# Patient Record
Sex: Female | Born: 1955 | Race: White | Hispanic: No | Smoking: Never smoker
Health system: Southern US, Community
[De-identification: ages and names within clinical notes are randomized; demographics above are authoritative.]

## PROBLEM LIST (undated history)

## (undated) DIAGNOSIS — K449 Diaphragmatic hernia without obstruction or gangrene: Secondary | ICD-10-CM

## (undated) DIAGNOSIS — Z90711 Acquired absence of uterus with remaining cervical stump: Secondary | ICD-10-CM

## (undated) DIAGNOSIS — M1990 Unspecified osteoarthritis, unspecified site: Secondary | ICD-10-CM

## (undated) DIAGNOSIS — F419 Anxiety disorder, unspecified: Secondary | ICD-10-CM

## (undated) DIAGNOSIS — Z9882 Breast implant status: Secondary | ICD-10-CM

## (undated) DIAGNOSIS — K2270 Barrett's esophagus without dysplasia: Secondary | ICD-10-CM

## (undated) HISTORY — PX: CHOLECYSTECTOMY: SHX55

## (undated) HISTORY — DX: Acquired absence of uterus with remaining cervical stump: Z90.711

## (undated) HISTORY — DX: Diaphragmatic hernia without obstruction or gangrene: K44.9

## (undated) HISTORY — DX: Unspecified osteoarthritis, unspecified site: M19.90

## (undated) HISTORY — DX: Breast implant status: Z98.82

## (undated) HISTORY — PX: OTHER SURGICAL HISTORY: SHX169

## (undated) HISTORY — PX: ABDOMINAL HYSTERECTOMY: SHX81

## (undated) HISTORY — DX: Barrett's esophagus without dysplasia: K22.70

## (undated) HISTORY — PX: WRIST SURGERY: SHX841

## (undated) HISTORY — DX: Anxiety disorder, unspecified: F41.9

## (undated) HISTORY — PX: NOSE SURGERY: SHX723

---

## 1978-07-26 DIAGNOSIS — Z90711 Acquired absence of uterus with remaining cervical stump: Secondary | ICD-10-CM

## 1978-07-26 HISTORY — DX: Acquired absence of uterus with remaining cervical stump: Z90.711

## 1987-07-27 HISTORY — PX: BREAST ENHANCEMENT SURGERY: SHX7

## 1988-07-26 DIAGNOSIS — Z9882 Breast implant status: Secondary | ICD-10-CM

## 1988-07-26 HISTORY — DX: Breast implant status: Z98.82

## 2013-08-15 ENCOUNTER — Ambulatory Visit (INDEPENDENT_AMBULATORY_CARE_PROVIDER_SITE_OTHER): Payer: BC Managed Care – PPO

## 2013-08-15 ENCOUNTER — Encounter: Payer: Self-pay | Admitting: Sports Medicine

## 2013-08-15 ENCOUNTER — Ambulatory Visit (INDEPENDENT_AMBULATORY_CARE_PROVIDER_SITE_OTHER): Payer: BC Managed Care – PPO | Admitting: Sports Medicine

## 2013-08-15 VITALS — BP 128/83 | HR 79 | Ht 64.0 in | Wt 151.0 lb

## 2013-08-15 DIAGNOSIS — M1710 Unilateral primary osteoarthritis, unspecified knee: Secondary | ICD-10-CM

## 2013-08-15 DIAGNOSIS — M19049 Primary osteoarthritis, unspecified hand: Secondary | ICD-10-CM

## 2013-08-15 DIAGNOSIS — F329 Major depressive disorder, single episode, unspecified: Secondary | ICD-10-CM

## 2013-08-15 DIAGNOSIS — M545 Low back pain, unspecified: Secondary | ICD-10-CM

## 2013-08-15 DIAGNOSIS — IMO0002 Reserved for concepts with insufficient information to code with codable children: Secondary | ICD-10-CM

## 2013-08-15 DIAGNOSIS — M170 Bilateral primary osteoarthritis of knee: Secondary | ICD-10-CM

## 2013-08-15 DIAGNOSIS — K209 Esophagitis, unspecified without bleeding: Secondary | ICD-10-CM

## 2013-08-15 DIAGNOSIS — F32A Depression, unspecified: Secondary | ICD-10-CM

## 2013-08-15 DIAGNOSIS — M47817 Spondylosis without myelopathy or radiculopathy, lumbosacral region: Secondary | ICD-10-CM

## 2013-08-15 DIAGNOSIS — M159 Polyosteoarthritis, unspecified: Secondary | ICD-10-CM

## 2013-08-15 DIAGNOSIS — Z0000 Encounter for general adult medical examination without abnormal findings: Secondary | ICD-10-CM | POA: Insufficient documentation

## 2013-08-15 DIAGNOSIS — Z113 Encounter for screening for infections with a predominantly sexual mode of transmission: Secondary | ICD-10-CM | POA: Insufficient documentation

## 2013-08-15 DIAGNOSIS — M25569 Pain in unspecified knee: Secondary | ICD-10-CM

## 2013-08-15 DIAGNOSIS — Z780 Asymptomatic menopausal state: Secondary | ICD-10-CM | POA: Insufficient documentation

## 2013-08-15 DIAGNOSIS — E663 Overweight: Secondary | ICD-10-CM

## 2013-08-15 DIAGNOSIS — F341 Dysthymic disorder: Secondary | ICD-10-CM

## 2013-08-15 DIAGNOSIS — K2270 Barrett's esophagus without dysplasia: Secondary | ICD-10-CM

## 2013-08-15 DIAGNOSIS — G4700 Insomnia, unspecified: Secondary | ICD-10-CM

## 2013-08-15 DIAGNOSIS — Z299 Encounter for prophylactic measures, unspecified: Secondary | ICD-10-CM

## 2013-08-15 DIAGNOSIS — F419 Anxiety disorder, unspecified: Secondary | ICD-10-CM

## 2013-08-15 DIAGNOSIS — N952 Postmenopausal atrophic vaginitis: Secondary | ICD-10-CM

## 2013-08-15 MED ORDER — ESCITALOPRAM OXALATE 20 MG PO TABS: 20.0000 mg | ORAL_TABLET | Freq: Every day | ORAL | Status: DC

## 2013-08-15 MED ORDER — MELOXICAM 15 MG PO TABS: ORAL_TABLET | ORAL | Status: DC

## 2013-08-15 MED ORDER — LINACLOTIDE 290 MCG PO CAPS: 290.0000 ug | ORAL_CAPSULE | Freq: Every day | ORAL | Status: DC

## 2013-08-15 MED ORDER — PHENTERMINE HCL 37.5 MG PO CAPS: 37.5000 mg | ORAL_CAPSULE | ORAL | Status: DC

## 2013-08-15 MED ORDER — ZOLPIDEM TARTRATE 5 MG PO TABS: 5.0000 mg | ORAL_TABLET | Freq: Every evening | ORAL | Status: DC | PRN

## 2013-08-15 MED ORDER — ALPRAZOLAM 1 MG PO TABS: 1.0000 mg | ORAL_TABLET | Freq: Every evening | ORAL | Status: DC | PRN

## 2013-08-15 MED ORDER — ESTROGENS, CONJUGATED 0.625 MG/GM VA CREA: TOPICAL_CREAM | VAGINAL | Status: DC

## 2013-08-15 MED ORDER — DEXLANSOPRAZOLE 60 MG PO CPDR: 60.0000 mg | DELAYED_RELEASE_CAPSULE | Freq: Every day | ORAL | Status: DC

## 2013-08-15 NOTE — Assessment & Plan Note (Signed)
X-rays. Mobic. Herniated disc exercises.

## 2013-08-15 NOTE — Assessment & Plan Note (Signed)
Continue Ambien. 

## 2013-08-15 NOTE — Assessment & Plan Note (Addendum)
Referral to OB/GYN for cervical cancer screening. Mammogram was 6 months ago. We will discuss further screening measures and labs at future visits.

## 2013-08-15 NOTE — Assessment & Plan Note (Signed)
X-rays, Mobic, home rehabilitation. Return in 4 weeks, injection if no better.

## 2013-08-15 NOTE — Assessment & Plan Note (Signed)
Overweight with complications of knee osteoarthritis. Adding phentermine.  Return monthly for weight check and refills.

## 2013-08-15 NOTE — Assessment & Plan Note (Signed)
Continue Lexapro and Xanax. Refilling.

## 2013-08-15 NOTE — Assessment & Plan Note (Signed)
X-rays, Mobic.

## 2013-08-15 NOTE — Progress Notes (Signed)
  Subjective:    CC: Establish care.   HPI: This is a very pleasant 58 year old female desires to switch providers, her husband died recently, and her prior PCP used to treat both of them, she desires a new change. She has multiple issues to discuss.  Anxiety and depression: Stable on Lexapro and alprazolam.  Atrophic vaginitis: Currently gets occasional burning and stinging of her vagina, mild itching without discharge, she is looking to get back into dating, and wonders if there is a cream that can help this. She is currently on oral estrogen replacement. She is status post partial hysterectomy.  Barrett's esophagus: very stable currently on PPIs.  Hand pain: Localized at the distal interphalangeal joints of both second and third fingers. She does have some nodules. Has not taken anything for this.   Low back pain: Axial without radiation, desires imaging.  Bilateral knee pain: Medial joint line and under the kneecap, worse when going up and down stairs, minimal swelling. Not taking any medication for this.  Insomnia: Well controlled with Ambien.  Overweight with complications: Desires to restart phentermine.  Preventive measure: Due for a Pap smear, she did have a partial hysterectomy, she does desire referral to gynecology here in the area.  Past medical history, Surgical history, Family history not pertinant except as noted below, Social history, Allergies, and medications have been entered into the medical record, reviewed, and no changes needed.   Review of Systems: No headache, visual changes, nausea, vomiting, diarrhea, constipation, dizziness, abdominal pain, skin rash, fevers, chills, night sweats, swollen lymph nodes, weight loss, chest pain, body aches, joint swelling, muscle aches, shortness of breath, mood changes, visual or auditory hallucinations.  Objective:    General: Well Developed, well nourished, and in no acute distress.  Neuro: Alert and oriented x3,  extra-ocular muscles intact, sensation grossly intact.  HEENT: Normocephalic, atraumatic, pupils equal round reactive to light, neck supple, no masses, no lymphadenopathy, thyroid nonpalpable.  Skin: Warm and dry, no rashes noted.  Cardiac: Regular rate and rhythm, no murmurs rubs or gallops.  Respiratory: Clear to auscultation bilaterally. Not using accessory muscles, speaking in full sentences.  Abdominal: Soft, nontender, nondistended, positive bowel sounds, no masses, no organomegaly.  Hands: Bilateral Bouchard and Heberden nodes. Bilateral Knee: Normal to inspection with no erythema or effusion or obvious bony abnormalities. Tender palpation of the medial joint lines.  all ligaments are intact and stable.  Negative Mcmurray's, Apley's, and Thessalonian tests. Painful patellar compression with crepitus Patellar and quadriceps tendons unremarkable. Hamstring and quadriceps strength is normal.   Impression and Recommendations:    The patient was counselled, risk factors were discussed, anticipatory guidance given.

## 2013-08-15 NOTE — Assessment & Plan Note (Signed)
Estrogen cream. I'm going to refer her [facility].

## 2013-08-15 NOTE — Assessment & Plan Note (Signed)
Continue Dexilant 

## 2013-08-20 MED ORDER — ESTRADIOL 0.1 MG/GM VA CREA
TOPICAL_CREAM | VAGINAL | Status: DC
Start: 2013-08-20 — End: 2015-03-14

## 2013-09-12 ENCOUNTER — Ambulatory Visit: Payer: Self-pay | Admitting: Sports Medicine

## 2013-09-19 ENCOUNTER — Encounter: Payer: BC Managed Care – PPO | Admitting: Obstetrics & Gynecology

## 2013-09-20 ENCOUNTER — Other Ambulatory Visit: Payer: Self-pay | Admitting: Sports Medicine

## 2013-09-21 ENCOUNTER — Other Ambulatory Visit: Payer: Self-pay

## 2013-10-17 ENCOUNTER — Encounter: Payer: BC Managed Care – PPO | Admitting: Obstetrics & Gynecology

## 2013-10-24 ENCOUNTER — Other Ambulatory Visit: Payer: Self-pay | Admitting: Sports Medicine

## 2013-11-15 ENCOUNTER — Other Ambulatory Visit: Payer: Self-pay

## 2013-11-15 ENCOUNTER — Other Ambulatory Visit: Payer: Self-pay | Admitting: Sports Medicine

## 2013-11-27 ENCOUNTER — Other Ambulatory Visit: Payer: Self-pay | Admitting: Sports Medicine

## 2013-11-27 ENCOUNTER — Other Ambulatory Visit: Payer: Self-pay

## 2013-12-11 ENCOUNTER — Ambulatory Visit: Payer: BC Managed Care – PPO | Admitting: Sports Medicine

## 2014-01-04 ENCOUNTER — Other Ambulatory Visit: Payer: Self-pay

## 2014-01-04 ENCOUNTER — Other Ambulatory Visit: Payer: Self-pay | Admitting: Sports Medicine

## 2014-01-06 ENCOUNTER — Other Ambulatory Visit: Payer: Self-pay | Admitting: Sports Medicine

## 2014-01-31 ENCOUNTER — Encounter: Payer: Self-pay | Admitting: Sports Medicine

## 2014-02-02 ENCOUNTER — Other Ambulatory Visit: Payer: Self-pay | Admitting: Sports Medicine

## 2014-02-04 ENCOUNTER — Other Ambulatory Visit: Payer: Self-pay

## 2014-02-07 ENCOUNTER — Other Ambulatory Visit: Payer: Self-pay | Admitting: Sports Medicine

## 2014-02-08 ENCOUNTER — Other Ambulatory Visit: Payer: Self-pay

## 2014-03-15 ENCOUNTER — Other Ambulatory Visit: Payer: Self-pay | Admitting: Sports Medicine

## 2014-03-15 ENCOUNTER — Telehealth: Payer: Self-pay

## 2014-03-15 NOTE — Telephone Encounter (Signed)
Error. Rhonda Cunningham,CMA  

## 2014-03-22 ENCOUNTER — Other Ambulatory Visit: Payer: Self-pay | Admitting: Sports Medicine

## 2014-03-22 MED ORDER — ZOLPIDEM TARTRATE 10 MG PO TABS
10.0000 mg | ORAL_TABLET | Freq: Every day | ORAL | Status: DC
Start: 2014-03-22 — End: 2014-04-23

## 2014-04-23 ENCOUNTER — Other Ambulatory Visit: Payer: Self-pay | Admitting: Sports Medicine

## 2014-05-01 ENCOUNTER — Other Ambulatory Visit: Payer: Self-pay | Admitting: Sports Medicine

## 2014-05-03 ENCOUNTER — Other Ambulatory Visit: Payer: Self-pay

## 2014-05-03 MED ORDER — ALPRAZOLAM 1 MG PO TABS: 1.0000 mg | ORAL_TABLET | ORAL | Status: DC | PRN

## 2014-05-28 ENCOUNTER — Ambulatory Visit (INDEPENDENT_AMBULATORY_CARE_PROVIDER_SITE_OTHER): Payer: BC Managed Care – PPO

## 2014-05-28 ENCOUNTER — Other Ambulatory Visit: Payer: Self-pay

## 2014-05-28 ENCOUNTER — Encounter: Payer: Self-pay | Admitting: Sports Medicine

## 2014-05-28 ENCOUNTER — Ambulatory Visit (INDEPENDENT_AMBULATORY_CARE_PROVIDER_SITE_OTHER): Payer: BC Managed Care – PPO | Admitting: Sports Medicine

## 2014-05-28 VITALS — BP 143/83 | HR 71 | Wt 151.0 lb

## 2014-05-28 VITALS — BP 143/83 | HR 71 | Ht 66.0 in | Wt 151.0 lb

## 2014-05-28 DIAGNOSIS — R0789 Other chest pain: Secondary | ICD-10-CM

## 2014-05-28 DIAGNOSIS — E663 Overweight: Secondary | ICD-10-CM | POA: Diagnosis not present

## 2014-05-28 DIAGNOSIS — R079 Chest pain, unspecified: Secondary | ICD-10-CM

## 2014-05-28 DIAGNOSIS — F32A Depression, unspecified: Secondary | ICD-10-CM

## 2014-05-28 DIAGNOSIS — M159 Polyosteoarthritis, unspecified: Secondary | ICD-10-CM

## 2014-05-28 DIAGNOSIS — E785 Hyperlipidemia, unspecified: Secondary | ICD-10-CM

## 2014-05-28 DIAGNOSIS — F329 Major depressive disorder, single episode, unspecified: Secondary | ICD-10-CM

## 2014-05-28 DIAGNOSIS — F418 Other specified anxiety disorders: Secondary | ICD-10-CM | POA: Diagnosis not present

## 2014-05-28 DIAGNOSIS — F419 Anxiety disorder, unspecified: Secondary | ICD-10-CM

## 2014-05-28 LAB — CBC
HCT: 38.1 % (ref 36.0–46.0)
Hemoglobin: 13.5 g/dL (ref 12.0–15.0)
MCH: 30.7 pg (ref 26.0–34.0)
MCHC: 35.4 g/dL (ref 30.0–36.0)
MCV: 86.6 fL (ref 78.0–100.0)
Platelets: 268 10*3/uL (ref 150–400)
RBC: 4.4 MIL/uL (ref 3.87–5.11)
RDW: 13.1 % (ref 11.5–15.5)
WBC: 5.4 10*3/uL (ref 4.0–10.5)

## 2014-05-28 LAB — COMPREHENSIVE METABOLIC PANEL WITH GFR
Albumin: 4.5 g/dL (ref 3.5–5.2)
BUN: 14 mg/dL (ref 6–23)
Glucose, Bld: 91 mg/dL (ref 70–99)
Potassium: 4.3 meq/L (ref 3.5–5.3)
Sodium: 137 meq/L (ref 135–145)
Total Protein: 6.8 g/dL (ref 6.0–8.3)

## 2014-05-28 LAB — TSH: TSH: 0.847 u[IU]/mL (ref 0.350–4.500)

## 2014-05-28 LAB — COMPREHENSIVE METABOLIC PANEL
ALT: 24 U/L (ref 0–35)
AST: 27 U/L (ref 0–37)
Alkaline Phosphatase: 58 U/L (ref 39–117)
CO2: 24 mEq/L (ref 19–32)
Calcium: 9.3 mg/dL (ref 8.4–10.5)
Chloride: 102 mEq/L (ref 96–112)
Creat: 0.76 mg/dL (ref 0.50–1.10)
Total Bilirubin: 0.5 mg/dL (ref 0.2–1.2)

## 2014-05-28 LAB — SEDIMENTATION RATE: Sed Rate: 1 mm/hr (ref 0–22)

## 2014-05-28 LAB — CK TOTAL AND CKMB (NOT AT ARMC)
CK, MB: 2 ng/mL (ref 0.0–5.0)
Relative Index: 2 (ref 0.0–4.0)
Total CK: 102 U/L (ref 7–177)

## 2014-05-28 LAB — LIPID PANEL
Cholesterol: 256 mg/dL — ABNORMAL HIGH (ref 0–200)
HDL: 66 mg/dL (ref >39)
LDL Cholesterol: 155 mg/dL — ABNORMAL HIGH (ref 0–99)
Total CHOL/HDL Ratio: 3.9 Ratio
Triglycerides: 176 mg/dL — ABNORMAL HIGH (ref <150)
VLDL: 35 mg/dL (ref 0–40)

## 2014-05-28 LAB — D-DIMER, QUANTITATIVE: D-Dimer, Quant: 0.48 ug{FEU}/mL (ref 0.00–0.48)

## 2014-05-28 LAB — TROPONIN I: Troponin I: <0.01 ng/mL (ref <0.06)

## 2014-05-28 LAB — HEMOGLOBIN A1C
Hgb A1c MFr Bld: 5.2 % (ref <5.7)
Mean Plasma Glucose: 103 mg/dL (ref <117)

## 2014-05-28 LAB — RHEUMATOID FACTOR: Rheumatoid fact SerPl-aCnc: <10 [IU]/mL (ref <=14)

## 2014-05-28 MED ORDER — ASPIRIN EC 81 MG PO TBEC: 81.0000 mg | DELAYED_RELEASE_TABLET | Freq: Every day | ORAL | Status: DC

## 2014-05-28 MED ORDER — NITROGLYCERIN 0.4 MG SL SUBL: 0.4000 mg | SUBLINGUAL_TABLET | SUBLINGUAL | Status: DC | PRN

## 2014-05-28 NOTE — Progress Notes (Signed)
  Subjective:    CC: chest pain and fatigue  HPI: This is a pleasant 58 year old female with anxiety and depression who comes in with a several week history of substernal chest pain radiating to the back, worse with exertion, better with rest, not associated with palpitations or nausea. No presyncope. Symptoms are also precipitated by stress and anxiety. She does endorse shortness of breath. She has no cardiac history, and has never had a stress test.  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 X-rays in the past have shown diffuse degenerative changes.  Past medical history, Surgical history, Family history not pertinant except as noted below, Social history, Allergies, and medications have been entered into the medical record, reviewed, and no changes needed.   Review of Systems: No fevers, chills, night sweats, weight loss, chest pain, or shortness of breath.   Objective:    General: Well Developed, well nourished, and in no acute distress.  Neuro: Alert and oriented x3, extra-ocular muscles intact, sensation grossly intact.  HEENT: Normocephalic, atraumatic, pupils equal round reactive to light, neck supple, no masses, no lymphadenopathy, thyroid nonpalpable.  Skin: Warm and dry, no rashes. Cardiac: Regular rate and rhythm, no murmurs rubs or gallops, no lower extremity edema.  Respiratory: Clear to auscultation bilaterally. Not using accessory muscles, speaking in full sentences.  Twelve-lead EKG shows mild bradycardia, no PR abnormalities, QRS, ST segments all unremarkable. Axis is normal.  Impression and Recommendations:

## 2014-05-28 NOTE — Assessment & Plan Note (Signed)
Discontinue phentermine for now. We can reconsider this in the future after catheterization versus stress test.

## 2014-05-28 NOTE — Assessment & Plan Note (Signed)
Rheumatoid workup. X-rays did show osteoarthritis. We can do interphalangeal joint injections at a future visit.

## 2014-05-28 NOTE — Assessment & Plan Note (Addendum)
Intermittent but substernal, radiating to the back. Relieved with rest, associated with sweating. Nitroglycerin, baby aspirin, referral to cardiology, I do think they will need to proceed directly to catheterization. Checking blood work including d-dimer, cardiac enzymes. Twelve-lead EKG. Return in 2 weeks. She will call me or 911 if she gets a recurrence of chest pain that is not relieved with nitroglycerin.

## 2014-05-28 NOTE — Progress Notes (Signed)
   Subjective:    Patient ID: Katherine Lawson, female    DOB: January 14, 1956, 58 y.o.   MRN: [id number]  HPI  Katherine Lawson is here for a EKG.  Review of Systems     Objective:   Physical Exam        Assessment & Plan:  EKG reviewed by Rodney Langtonhomas Thekkekandam, MD

## 2014-05-28 NOTE — Assessment & Plan Note (Signed)
Certainly her anxiety and panic could be a cause of her chest pain. We will rule out a cardiovascular cause first.

## 2014-05-29 DIAGNOSIS — E782 Mixed hyperlipidemia: Secondary | ICD-10-CM | POA: Insufficient documentation

## 2014-05-29 DIAGNOSIS — E785 Hyperlipidemia, unspecified: Secondary | ICD-10-CM | POA: Insufficient documentation

## 2014-05-29 LAB — CYCLIC CITRUL PEPTIDE ANTIBODY, IGG: Cyclic Citrullin Peptide Ab: <2 U/mL (ref 0.0–5.0)

## 2014-05-29 LAB — ANA: Anti Nuclear Antibody(ANA): NEGATIVE

## 2014-05-29 MED ORDER — ATORVASTATIN CALCIUM 40 MG PO TABS: 40.0000 mg | ORAL_TABLET | Freq: Every day | ORAL | Status: DC

## 2014-05-29 NOTE — Addendum Note (Signed)
Addended by: Monica BectonHEKKEKANDAM, Melvine Julin J on: 05/29/2014 12:01 PM   Modules accepted: Orders

## 2014-05-29 NOTE — Assessment & Plan Note (Signed)
Adding high-dose statin.

## 2014-05-31 ENCOUNTER — Other Ambulatory Visit: Payer: Self-pay | Admitting: Sports Medicine

## 2014-06-05 ENCOUNTER — Encounter: Payer: Self-pay | Admitting: Sports Medicine

## 2014-06-11 ENCOUNTER — Encounter: Payer: Self-pay | Admitting: Sports Medicine

## 2014-06-11 ENCOUNTER — Ambulatory Visit (INDEPENDENT_AMBULATORY_CARE_PROVIDER_SITE_OTHER): Payer: BC Managed Care – PPO | Admitting: Sports Medicine

## 2014-06-11 VITALS — BP 125/75 | HR 67 | Ht 64.0 in | Wt 150.0 lb

## 2014-06-11 DIAGNOSIS — F418 Other specified anxiety disorders: Secondary | ICD-10-CM

## 2014-06-11 DIAGNOSIS — R079 Chest pain, unspecified: Secondary | ICD-10-CM

## 2014-06-11 DIAGNOSIS — F329 Major depressive disorder, single episode, unspecified: Secondary | ICD-10-CM

## 2014-06-11 DIAGNOSIS — M159 Polyosteoarthritis, unspecified: Secondary | ICD-10-CM | POA: Diagnosis not present

## 2014-06-11 DIAGNOSIS — Z23 Encounter for immunization: Secondary | ICD-10-CM

## 2014-06-11 DIAGNOSIS — F419 Anxiety disorder, unspecified: Secondary | ICD-10-CM

## 2014-06-11 DIAGNOSIS — E785 Hyperlipidemia, unspecified: Secondary | ICD-10-CM

## 2014-06-11 DIAGNOSIS — F32A Depression, unspecified: Secondary | ICD-10-CM

## 2014-06-11 MED ORDER — ALPRAZOLAM 1 MG PO TABS: 1.0000 mg | ORAL_TABLET | ORAL | Status: DC | PRN

## 2014-06-11 NOTE — Assessment & Plan Note (Addendum)
Certainly worsen, it is her husband's death anniversary. Continue current medication for now. We are going to rule out a cardiac etiology of her chest pain before blaming it on panic. Refilling alprazolam, #30 per month.

## 2014-06-11 NOTE — Progress Notes (Signed)
  Subjective:    CC: follow-up  HPI: Chest pain: Persistent when stressed, she did see cardiology and they are planning stress test.  Cardiac enzymes were all negative the last visit.  Hyperlipidemia: Continuing Lipitor.  Hand osteoarthritis: Moderate, persistent, localized at the left second DIP in the right third DIP, amenable to trying injection today.  Anxiety and depression: Stable, using approximately 1 alprazolam per day, no suicidal or homicidal ideation.  Past medical history, Surgical history, Family history not pertinant except as noted below, Social history, Allergies, and medications have been entered into the medical record, reviewed, and no changes needed.   Review of Systems: No fevers, chills, night sweats, weight loss, chest pain, or shortness of breath.   Objective:    General: Well Developed, well nourished, and in no acute distress.  Neuro: Alert and oriented x3, extra-ocular muscles intact, sensation grossly intact.  HEENT: Normocephalic, atraumatic, pupils equal round reactive to light, neck supple, no masses, no lymphadenopathy, thyroid nonpalpable.  Skin: Warm and dry, no rashes. Cardiac: Regular rate and rhythm, no murmurs rubs or gallops, no lower extremity edema.  Respiratory: Clear to auscultation bilaterally. Not using accessory muscles, speaking in full sentences.  Procedure: Real-time Ultrasound Guided Injection of left second distal interphalangeal joint Device: GE Logiq E  Verbal informed consent obtained.  Time-out conducted.  Noted no overlying erythema, induration, or other signs of local infection.  Skin prepped in a sterile fashion.  Local anesthesia: Topical Ethyl chloride.  With sterile technique and under real time ultrasound guidance:  0.5 mL kenalog 40, 0.5 mL lidocaine injected easily. Completed without difficulty  Pain immediately resolved suggesting accurate placement of the medication.  Advised to call if fevers/chills, erythema,  induration, drainage, or persistent bleeding.  Images permanently stored and available for review in the ultrasound unit.  Impression: Technically successful ultrasound guided injection.  Procedure: Real-time Ultrasound Guided Injection of right third distal interphalangeal joint Device: GE Logiq E  Verbal informed consent obtained.  Time-out conducted.  Noted no overlying erythema, induration, or other signs of local infection.  Skin prepped in a sterile fashion.  Local anesthesia: Topical Ethyl chloride.  With sterile technique and under real time ultrasound guidance:  0.5 mL kenalog 40, 0.5 mL lidocaine injected easily. Completed without difficulty  Pain immediately resolved suggesting accurate placement of the medication.  Advised to call if fevers/chills, erythema, induration, drainage, or persistent bleeding.  Images permanently stored and available for review in the ultrasound unit.  Impression: Technically successful ultrasound guided injection.  Impression and Recommendations:

## 2014-06-11 NOTE — Assessment & Plan Note (Signed)
Does have a stress test coming up in a couple of days with cardiology. Currently treating her hyperlipidemia aggressively.

## 2014-06-11 NOTE — Assessment & Plan Note (Addendum)
Left second and right third distal interphalangeal joint injections as above. Return in one month.

## 2014-06-11 NOTE — Assessment & Plan Note (Signed)
-  Continue Lipitor °

## 2014-07-03 ENCOUNTER — Institutional Professional Consult (permissible substitution): Payer: BC Managed Care – PPO | Admitting: Cardiology

## 2014-07-05 ENCOUNTER — Other Ambulatory Visit: Payer: Self-pay | Admitting: Sports Medicine

## 2014-07-10 ENCOUNTER — Encounter: Payer: Self-pay | Admitting: Sports Medicine

## 2014-07-11 ENCOUNTER — Ambulatory Visit: Payer: BC Managed Care – PPO | Admitting: Sports Medicine

## 2014-07-12 ENCOUNTER — Other Ambulatory Visit: Payer: Self-pay | Admitting: Sports Medicine

## 2014-07-14 ENCOUNTER — Other Ambulatory Visit: Payer: Self-pay | Admitting: Sports Medicine

## 2014-07-21 ENCOUNTER — Other Ambulatory Visit: Payer: Self-pay | Admitting: Sports Medicine

## 2014-08-05 ENCOUNTER — Ambulatory Visit: Payer: BC Managed Care – PPO | Admitting: Sports Medicine

## 2014-08-09 ENCOUNTER — Other Ambulatory Visit: Payer: Self-pay | Admitting: Sports Medicine

## 2014-08-19 ENCOUNTER — Other Ambulatory Visit: Payer: Self-pay | Admitting: Sports Medicine

## 2014-09-04 ENCOUNTER — Telehealth: Payer: Self-pay

## 2014-09-04 NOTE — Telephone Encounter (Signed)
Faxed prior auth paperwork for Dexilant to [id number] at 9:00am 09/04/14 - CF

## 2014-09-04 NOTE — Telephone Encounter (Signed)
Received fax from [facility] they have stated that the Dexilant is on the plans list of covered meds and patient can get prescription filled at pharmacy. - CF

## 2014-09-09 ENCOUNTER — Other Ambulatory Visit: Payer: Self-pay | Admitting: Sports Medicine

## 2014-09-16 ENCOUNTER — Other Ambulatory Visit: Payer: Self-pay | Admitting: Sports Medicine

## 2014-09-29 ENCOUNTER — Other Ambulatory Visit: Payer: Self-pay | Admitting: Sports Medicine

## 2014-10-14 ENCOUNTER — Other Ambulatory Visit: Payer: Self-pay | Admitting: Sports Medicine

## 2014-10-15 ENCOUNTER — Telehealth: Payer: Self-pay

## 2014-10-15 NOTE — Telephone Encounter (Signed)
Pharmacist called requested clarification on directions for Xanax that was sent to pharmacy for patient. Please clarify. Rhonda Cunningham,CMA

## 2014-10-15 NOTE — Telephone Encounter (Signed)
PHARMACY HAS BEEN NOTIFIED. Katherine Lawson,CMA

## 2014-10-15 NOTE — Telephone Encounter (Signed)
1 tablet up to daily as needed

## 2014-10-17 ENCOUNTER — Telehealth: Payer: Self-pay

## 2014-10-17 MED ORDER — ZOLPIDEM TARTRATE 10 MG PO TABS: ORAL_TABLET | ORAL | Status: DC

## 2014-10-17 NOTE — Telephone Encounter (Signed)
Patient request refill for Ambien 10 mg. Patient was scheduled a follow up appt. Rhonda Cunningham,CMA

## 2014-10-25 ENCOUNTER — Ambulatory Visit (INDEPENDENT_AMBULATORY_CARE_PROVIDER_SITE_OTHER): Payer: 59 | Admitting: Sports Medicine

## 2014-10-25 ENCOUNTER — Encounter: Payer: Self-pay | Admitting: Sports Medicine

## 2014-10-25 VITALS — BP 118/72 | HR 70 | Ht 64.0 in | Wt 154.0 lb

## 2014-10-25 DIAGNOSIS — Z780 Asymptomatic menopausal state: Secondary | ICD-10-CM | POA: Diagnosis not present

## 2014-10-25 DIAGNOSIS — R079 Chest pain, unspecified: Secondary | ICD-10-CM

## 2014-10-25 DIAGNOSIS — E663 Overweight: Secondary | ICD-10-CM | POA: Diagnosis not present

## 2014-10-25 MED ORDER — PHENTERMINE HCL 37.5 MG PO TABS: ORAL_TABLET | ORAL | Status: DC

## 2014-10-25 NOTE — Progress Notes (Signed)
  Subjective:    CC: Discuss hormone replacement  HPI: Chest pain: Negative cardiac stress test.  Overweight: Minimal to start weight loss medication.  Perimenopausal/postmenopausal: Is currently getting implantable estrogen by a local complementary and alternative medicine clinic, would like me to add Premarin cream.  Past medical history, Surgical history, Family history not pertinant except as noted below, Social history, Allergies, and medications have been entered into the medical record, reviewed, and no changes needed.   Review of Systems: No fevers, chills, night sweats, weight loss, chest pain, or shortness of breath.   Objective:    General: Well Developed, well nourished, and in no acute distress.  Neuro: Alert and oriented x3, extra-ocular muscles intact, sensation grossly intact.  HEENT: Normocephalic, atraumatic, pupils equal round reactive to light, neck supple, no masses, no lymphadenopathy, thyroid nonpalpable.  Skin: Warm and dry, no rashes. Cardiac: Regular rate and rhythm, no murmurs rubs or gallops, no lower extremity edema.  Respiratory: Clear to auscultation bilaterally. Not using accessory muscles, speaking in full sentences.  Impression and Recommendations:

## 2014-10-25 NOTE — Assessment & Plan Note (Signed)
History of stress test, chest pain was likely due to anxiety.

## 2014-10-25 NOTE — Assessment & Plan Note (Signed)
Overweight with osteoarthritis. Next line starting phentermine. Return monthly for weight checks and refills.

## 2014-10-25 NOTE — Assessment & Plan Note (Addendum)
Patient will continue hormone replacement both injectable as well as topical through her complimentary and alternative providers. They seem to be worried about bone density loss, we will simply check a bone densitometry test.

## 2014-10-30 ENCOUNTER — Other Ambulatory Visit: Payer: 59

## 2014-11-12 ENCOUNTER — Other Ambulatory Visit: Payer: Self-pay | Admitting: Sports Medicine

## 2014-11-15 ENCOUNTER — Other Ambulatory Visit: Payer: Self-pay | Admitting: Sports Medicine

## 2014-11-17 ENCOUNTER — Other Ambulatory Visit: Payer: Self-pay | Admitting: Sports Medicine

## 2014-11-22 ENCOUNTER — Ambulatory Visit (INDEPENDENT_AMBULATORY_CARE_PROVIDER_SITE_OTHER): Payer: 59 | Admitting: Sports Medicine

## 2014-11-22 ENCOUNTER — Encounter: Payer: Self-pay | Admitting: Sports Medicine

## 2014-11-22 DIAGNOSIS — M170 Bilateral primary osteoarthritis of knee: Secondary | ICD-10-CM | POA: Diagnosis not present

## 2014-11-22 DIAGNOSIS — E663 Overweight: Secondary | ICD-10-CM

## 2014-11-22 MED ORDER — MELOXICAM 15 MG PO TABS: 15.0000 mg | ORAL_TABLET | Freq: Every day | ORAL | Status: DC

## 2014-11-22 MED ORDER — TOPIRAMATE 50 MG PO TABS: ORAL_TABLET | ORAL | Status: DC

## 2014-11-22 MED ORDER — PHENTERMINE HCL 37.5 MG PO TABS: ORAL_TABLET | ORAL | Status: DC

## 2014-11-22 NOTE — Assessment & Plan Note (Signed)
5 pound weight loss on phentermine. We discussed some dieting strategies as well as alternatives to Pasta and carbohydrates. Adding Topamax and refilling intermittent. Return in one month for a weight check.

## 2014-11-22 NOTE — Progress Notes (Signed)
  Subjective:    CC: Follow-up  HPI: Obesity: 5 pound weight loss since last visit, has struggled with cutting out carbohydrates.  Past medical history, Surgical history, Family history not pertinant except as noted below, Social history, Allergies, and medications have been entered into the medical record, reviewed, and no changes needed.   Review of Systems: No fevers, chills, night sweats, weight loss, chest pain, or shortness of breath.   Objective:    General: Well Developed, well nourished, and in no acute distress.  Neuro: Alert and oriented x3, extra-ocular muscles intact, sensation grossly intact.  HEENT: Normocephalic, atraumatic, pupils equal round reactive to light, neck supple, no masses, no lymphadenopathy, thyroid nonpalpable.  Skin: Warm and dry, no rashes. Cardiac: Regular rate and rhythm, no murmurs rubs or gallops, no lower extremity edema.  Respiratory: Clear to auscultation bilaterally. Not using accessory muscles, speaking in full sentences.  Impression and Recommendations:    I spent 25 minutes with this patient, greater than 50% was face-to-face time counseling regarding the above diagnosis.

## 2014-11-22 NOTE — Assessment & Plan Note (Signed)
Doing well with meloxicam, refilling medication.

## 2014-12-15 ENCOUNTER — Other Ambulatory Visit: Payer: Self-pay | Admitting: Sports Medicine

## 2014-12-20 ENCOUNTER — Ambulatory Visit: Payer: 59 | Admitting: Sports Medicine

## 2014-12-23 ENCOUNTER — Other Ambulatory Visit: Payer: Self-pay | Admitting: Sports Medicine

## 2014-12-24 NOTE — Telephone Encounter (Signed)
Request for Phentermine, refused. Pt needs an appt for refills on that Rx. Attempted to contact Pt to set appt, no answer. Left voicemail providing callback information.

## 2014-12-31 ENCOUNTER — Telehealth: Payer: Self-pay

## 2014-12-31 DIAGNOSIS — M170 Bilateral primary osteoarthritis of knee: Secondary | ICD-10-CM

## 2014-12-31 NOTE — Telephone Encounter (Signed)
done, is there are specific diagnosis for complaint that they will be working on?

## 2014-12-31 NOTE — Telephone Encounter (Signed)
Patient left a voicemail requesting a referral for [facility]. Brix Brearley,CMA

## 2014-12-31 NOTE — Telephone Encounter (Signed)
No specific reason patient just request a referral. Estelle Junehonda Caili Escalera,CMA

## 2015-01-19 ENCOUNTER — Other Ambulatory Visit: Payer: Self-pay | Admitting: Sports Medicine

## 2015-01-20 ENCOUNTER — Other Ambulatory Visit: Payer: Self-pay | Admitting: Sports Medicine

## 2015-02-19 ENCOUNTER — Other Ambulatory Visit: Payer: Self-pay | Admitting: Sports Medicine

## 2015-02-24 ENCOUNTER — Other Ambulatory Visit: Payer: Self-pay | Admitting: Sports Medicine

## 2015-02-27 ENCOUNTER — Other Ambulatory Visit: Payer: Self-pay | Admitting: Sports Medicine

## 2015-03-10 ENCOUNTER — Encounter: Payer: 59 | Admitting: Obstetrics & Gynecology

## 2015-03-10 ENCOUNTER — Ambulatory Visit: Payer: 59 | Admitting: Family Medicine

## 2015-03-14 ENCOUNTER — Ambulatory Visit (INDEPENDENT_AMBULATORY_CARE_PROVIDER_SITE_OTHER): Payer: 59 | Admitting: Sports Medicine

## 2015-03-14 ENCOUNTER — Encounter: Payer: Self-pay | Admitting: Sports Medicine

## 2015-03-14 VITALS — BP 147/85 | HR 62 | Ht 64.0 in | Wt 142.0 lb

## 2015-03-14 DIAGNOSIS — G4700 Insomnia, unspecified: Secondary | ICD-10-CM

## 2015-03-14 DIAGNOSIS — E663 Overweight: Secondary | ICD-10-CM

## 2015-03-14 DIAGNOSIS — F32A Depression, unspecified: Secondary | ICD-10-CM

## 2015-03-14 DIAGNOSIS — Z780 Asymptomatic menopausal state: Secondary | ICD-10-CM

## 2015-03-14 DIAGNOSIS — F418 Other specified anxiety disorders: Secondary | ICD-10-CM | POA: Diagnosis not present

## 2015-03-14 DIAGNOSIS — F329 Major depressive disorder, single episode, unspecified: Secondary | ICD-10-CM

## 2015-03-14 DIAGNOSIS — L219 Seborrheic dermatitis, unspecified: Secondary | ICD-10-CM | POA: Insufficient documentation

## 2015-03-14 DIAGNOSIS — F419 Anxiety disorder, unspecified: Secondary | ICD-10-CM

## 2015-03-14 DIAGNOSIS — F5231 Female orgasmic disorder: Secondary | ICD-10-CM

## 2015-03-14 MED ORDER — SUVOREXANT 10 MG PO TABS: 1.0000 | ORAL_TABLET | Freq: Every day | ORAL | Status: DC

## 2015-03-14 MED ORDER — ESTROGENS, CONJUGATED 0.625 MG/GM VA CREA: TOPICAL_CREAM | VAGINAL | Status: DC

## 2015-03-14 MED ORDER — ALPRAZOLAM 2 MG PO TABS: 2.0000 mg | ORAL_TABLET | Freq: Every day | ORAL | Status: DC | PRN

## 2015-03-14 MED ORDER — TOPIRAMATE 100 MG PO TABS: 100.0000 mg | ORAL_TABLET | Freq: Every day | ORAL | Status: DC

## 2015-03-14 MED ORDER — TRIAMCINOLONE ACETONIDE 0.1 % EX CREA: 1.0000 "application " | TOPICAL_CREAM | Freq: Two times a day (BID) | CUTANEOUS | Status: DC

## 2015-03-14 NOTE — Assessment & Plan Note (Signed)
Has discontinued Lexapro. Continue Xanax, she uses one per day. She does desire to increase to 2 mg.

## 2015-03-14 NOTE — Assessment & Plan Note (Signed)
Currently doing well with topical and injectable estrogen

## 2015-03-14 NOTE — Assessment & Plan Note (Signed)
Good weight loss on Topamax alone, also good control of her migraines, increasing to 100 mg.

## 2015-03-14 NOTE — Assessment & Plan Note (Signed)
Multifactorial including sensation of guilt with having an orgasm without her husband who has passed away. She has tried both with a female partner as well as with masturbation without success, she gets on the verge and is unable to continue. I have asked her to try an alprazolam prior to masturbation. If still unable to achieve orgasm we will try Addyi.

## 2015-03-14 NOTE — Progress Notes (Signed)
  Subjective:    CC: Follow-up  HPI: Anxiety: Has self discontinued her Lexapro, overall feeling well in terms of her mood with the exception of occasional anxiety not well-controlled with 1 mg and it. Does desire to go up on her Xanax dose.  Skin rash: Localized at the nasolabial folds, neck, eyebrows, pruritic, overall doing well right now.  Insomnia: Is able to get sleep with a normal sleep latency however is unable to stay asleep with Ambien 10.  Abnormal weight gain: Doing well with weight loss on Topamax, she is on 50 mg, and has also noted fantastic improvement in her migraines. Amenable to increase to 100 mg.  Difficulty achieving orgasm: Has not had an orgasm for some time, years, since her husband died. She has started dating again but unfortunately has still been unable to reach an orgasm both with her partner as well as through masturbation. She does use various vibrators to help but is still unable to, she tells me that she reaches peak, but feels guilty and is unable to proceed. She is not yet tried a Xanax prior to masturbation. Overall she does well with Estrogen supplementation both topical and injectable. No vaginal mucosal dryness  Past medical history, Surgical history, Family history not pertinant except as noted below, Social history, Allergies, and medications have been entered into the medical record, reviewed, and no changes needed.   Review of Systems: No fevers, chills, night sweats, weight loss, chest pain, or shortness of breath.   Objective:    General: Well Developed, well nourished, and in no acute distress.  Neuro: Alert and oriented x3, extra-ocular muscles intact, sensation grossly intact.  HEENT: Normocephalic, atraumatic, pupils equal round reactive to light, neck supple, no masses, no lymphadenopathy, thyroid nonpalpable.  Skin: Warm and dry, no rashes. Cardiac: Regular rate and rhythm, no murmurs rubs or gallops, no lower extremity edema.  Respiratory:  Clear to auscultation bilaterally. Not using accessory muscles, speaking in full sentences.  Impression and Recommendations:

## 2015-03-14 NOTE — Assessment & Plan Note (Signed)
Insufficient response to Ambien, switching to Belsomra, trial coupon given.

## 2015-03-14 NOTE — Assessment & Plan Note (Signed)
Adding low-dose triamcinolone cream

## 2015-03-17 ENCOUNTER — Encounter: Payer: 59 | Admitting: Obstetrics & Gynecology

## 2015-03-17 DIAGNOSIS — Z01419 Encounter for gynecological examination (general) (routine) without abnormal findings: Secondary | ICD-10-CM

## 2015-03-20 ENCOUNTER — Telehealth: Payer: Self-pay

## 2015-03-20 DIAGNOSIS — E663 Overweight: Secondary | ICD-10-CM

## 2015-03-20 NOTE — Telephone Encounter (Signed)
PATIENT CALLED STATED THAT DURING HER OFFICE VISIT ON 03/14/15 THE WEIGHT MEDICATION WAS OVERLOOKED AND SHE IS REQUESTING A RX FOR PHENTERMINE. PATIENT STATED THAT SHE NEEDS TO GO BACK ON IT DUE TO HER BEING IN THE GYM NOW SHE IS STARTING TO EAT UP EVERYTHING. Katherine Lawson,CMA

## 2015-03-21 MED ORDER — PHENTERMINE HCL 37.5 MG PO TABS: ORAL_TABLET | ORAL | Status: DC

## 2015-03-21 NOTE — Telephone Encounter (Signed)
We had decided to do Topamax alone and increase the dose. if she would like to go back on phentermine I will write a prescription but she needs to return for monthly weight checks and refills.

## 2015-03-24 NOTE — Telephone Encounter (Signed)
Patient has been informed and Phentermine Rx has been faxed  To pharmacy. Wreatha Sturgeon,CMA

## 2015-03-27 ENCOUNTER — Telehealth: Payer: Self-pay | Admitting: Sports Medicine

## 2015-03-27 NOTE — Telephone Encounter (Signed)
Received fax for prior authorization on Belsomra 10 mg I called [facility] and spoke with [facility] and did the prior authorization over the phone and  medication is approved from 03/27/2015 - 03/26/2016. [id number] - CF

## 2015-03-29 ENCOUNTER — Other Ambulatory Visit: Payer: Self-pay | Admitting: Sports Medicine

## 2015-04-07 ENCOUNTER — Telehealth: Payer: Self-pay

## 2015-04-07 DIAGNOSIS — G4700 Insomnia, unspecified: Secondary | ICD-10-CM

## 2015-04-07 MED ORDER — SUVOREXANT 5 MG PO TABS: 5.0000 mg | ORAL_TABLET | Freq: Every day | ORAL | Status: DC

## 2015-04-07 NOTE — Telephone Encounter (Signed)
Patient called stated that she is having muscle weakness, feeling tired and a lot of constipation since has started taking the Belsoma and she is requesting to go back Ambien 10 mg. Please advise patient uses [facility]. Marcy Panning. Antwon Rochin,CMA

## 2015-04-07 NOTE — Telephone Encounter (Signed)
LEFT A MESSAGE ON PATIENT VM WITH INSTRUCTIONS AS NOTED BELOW ALSO ADVISED PATIENT TO CALL ME BACK AND LET ME KNOW HOW THAT WENT.Katherine Lawson Rhonda Cunningham,CMA

## 2015-04-07 NOTE — Telephone Encounter (Signed)
Patient request a Rx for Belsoma 5 mg sent to [facility] [place]. Jackie Littlejohn,CMA

## 2015-04-07 NOTE — Telephone Encounter (Signed)
First, try breaking belsomra in half for a few nights.

## 2015-04-10 ENCOUNTER — Telehealth: Payer: Self-pay

## 2015-04-10 DIAGNOSIS — G4700 Insomnia, unspecified: Secondary | ICD-10-CM

## 2015-04-10 MED ORDER — ESZOPICLONE 2 MG PO TABS: 2.0000 mg | ORAL_TABLET | Freq: Every evening | ORAL | Status: DC | PRN

## 2015-04-10 NOTE — Telephone Encounter (Signed)
Switching to Lunesta. Rx in box.

## 2015-04-10 NOTE — Telephone Encounter (Signed)
Patient called stated that Belsomra will cost her $120 and she can not afford that advised patientto use discount coupon and she stated that it can only be used on 10 mg or above. Patient request a different Rx sent to her pharmacy. Deltha Bernales,CMA

## 2015-04-10 NOTE — Assessment & Plan Note (Signed)
Intolerant to Belsomra and Ambien, trying Lunesta.

## 2015-04-11 ENCOUNTER — Encounter: Payer: Self-pay | Admitting: Sports Medicine

## 2015-04-11 ENCOUNTER — Ambulatory Visit (INDEPENDENT_AMBULATORY_CARE_PROVIDER_SITE_OTHER): Payer: 59 | Admitting: Sports Medicine

## 2015-04-11 VITALS — BP 112/65 | HR 79 | Wt 139.0 lb

## 2015-04-11 DIAGNOSIS — E663 Overweight: Secondary | ICD-10-CM

## 2015-04-11 DIAGNOSIS — L219 Seborrheic dermatitis, unspecified: Secondary | ICD-10-CM

## 2015-04-11 DIAGNOSIS — K589 Irritable bowel syndrome without diarrhea: Secondary | ICD-10-CM

## 2015-04-11 DIAGNOSIS — K581 Irritable bowel syndrome with constipation: Secondary | ICD-10-CM | POA: Insufficient documentation

## 2015-04-11 DIAGNOSIS — G4700 Insomnia, unspecified: Secondary | ICD-10-CM | POA: Diagnosis not present

## 2015-04-11 DIAGNOSIS — F5231 Female orgasmic disorder: Secondary | ICD-10-CM | POA: Diagnosis not present

## 2015-04-11 MED ORDER — LINACLOTIDE 145 MCG PO CAPS: 145.0000 ug | ORAL_CAPSULE | Freq: Every day | ORAL | Status: DC

## 2015-04-11 MED ORDER — PHENTERMINE HCL 37.5 MG PO TABS: ORAL_TABLET | ORAL | Status: DC

## 2015-04-11 MED ORDER — LINACLOTIDE 290 MCG PO CAPS: 290.0000 ug | ORAL_CAPSULE | Freq: Every day | ORAL | Status: DC

## 2015-04-11 NOTE — Assessment & Plan Note (Signed)
Inadequate response to Ambien and Belsomra, switching to [place].

## 2015-04-11 NOTE — Assessment & Plan Note (Signed)
Resolved with adding Xanax before sexual encounters.

## 2015-04-11 NOTE — Assessment & Plan Note (Signed)
Adding Linzess. 

## 2015-04-11 NOTE — Progress Notes (Signed)
  Subjective:    CC: Follow-up  HPI: Insomnia: Has not yet started Lunesta, did not tolerate Ambien or Belsomra.  Inhibited female orgasm: Last time I asked her to take a Xanax and then masturbate, she did this successfully, and was able to have relations with her female partner as well successfully.  Facial rash/seborrheic keratosis: Resolved with topical triamcinolone.  Overweight:  Continues to do well with Topamax.  Past medical history, Surgical history, Family history not pertinant except as noted below, Social history, Allergies, and medications have been entered into the medical record, reviewed, and no changes needed.   Review of Systems: No fevers, chills, night sweats, weight loss, chest pain, or shortness of breath.   Objective:    General: Well Developed, well nourished, and in no acute distress.  Neuro: Alert and oriented x3, extra-ocular muscles intact, sensation grossly intact.  HEENT: Normocephalic, atraumatic, pupils equal round reactive to light, neck supple, no masses, no lymphadenopathy, thyroid nonpalpable.  Skin: Warm and dry, no rashes. Cardiac: Regular rate and rhythm, no murmurs rubs or gallops, no lower extremity edema.  Respiratory: Clear to auscultation bilaterally. Not using accessory muscles, speaking in full sentences.  Impression and Recommendations:    I spent 25 minutes with this patient, greater than 50% was face-to-face time counseling regarding the above diagnoses

## 2015-04-11 NOTE — Assessment & Plan Note (Signed)
Good continued weight loss on Topamax and phentermine.

## 2015-04-11 NOTE — Assessment & Plan Note (Signed)
Resolved with topical Kenalog 

## 2015-04-11 NOTE — Telephone Encounter (Signed)
Rx has been faxed to [facility]. Rhonda Cunningham,CMA  

## 2015-04-17 ENCOUNTER — Telehealth: Payer: Self-pay | Admitting: Sports Medicine

## 2015-04-17 NOTE — Telephone Encounter (Signed)
Pt called to see if she can get a referral to a GI Specialist in [place]. Pt is also waiting on PA authorization for Linzess, she has been taking OTC stool softeners and they have not helped. Will route to PCP for approval on referral and to see if any alternative options for IBS symptoms while waiting on PA.

## 2015-04-17 NOTE — Telephone Encounter (Signed)
Not sure we need GI, maybe she can come pick up a discount coupon.

## 2015-04-18 NOTE — Telephone Encounter (Signed)
Attempted to contact Pt, no answer. Linzess PA has been approved and pharmacy notified.

## 2015-04-18 NOTE — Telephone Encounter (Signed)
[employer] auth #: [id number]. Valid: 04/18/15-10/16/15.

## 2015-04-22 ENCOUNTER — Telehealth: Payer: Self-pay

## 2015-04-23 NOTE — Telephone Encounter (Signed)
Error. Rhonda Cunningham,CMA  

## 2015-05-07 ENCOUNTER — Other Ambulatory Visit: Payer: Self-pay | Admitting: Sports Medicine

## 2015-05-16 ENCOUNTER — Other Ambulatory Visit: Payer: Self-pay | Admitting: Sports Medicine

## 2015-05-29 ENCOUNTER — Encounter: Payer: Self-pay | Admitting: Sports Medicine

## 2015-05-29 ENCOUNTER — Ambulatory Visit (INDEPENDENT_AMBULATORY_CARE_PROVIDER_SITE_OTHER): Payer: 59 | Admitting: Sports Medicine

## 2015-05-29 VITALS — BP 135/80 | HR 83 | Wt 138.0 lb

## 2015-05-29 DIAGNOSIS — G4700 Insomnia, unspecified: Secondary | ICD-10-CM

## 2015-05-29 DIAGNOSIS — M170 Bilateral primary osteoarthritis of knee: Secondary | ICD-10-CM

## 2015-05-29 MED ORDER — MELOXICAM 15 MG PO TABS: 15.0000 mg | ORAL_TABLET | Freq: Every day | ORAL | Status: DC

## 2015-05-29 MED ORDER — ESZOPICLONE 3 MG PO TABS: 3.0000 mg | ORAL_TABLET | Freq: Every day | ORAL | Status: DC

## 2015-05-29 NOTE — Assessment & Plan Note (Signed)
Increasing Lunesta to 3 mg

## 2015-05-29 NOTE — Progress Notes (Signed)
  Subjective:    CC: left knee pain  HPI: This is a pleasant 59 year old female She has for the past couple of weeks pain in the anterior aspect of her right knee, after a spin class, pain is moderate, persistent, worsening going up and no stairs and localized at the superior pole of the patella. No mechanical symptoms, minimal swelling.  Insomnia: Needs an increase in the dose of Lunesta, it helps but insufficiently through the night.  Past medical history, Surgical history, Family history not pertinant except as noted below, Social history, Allergies, and medications have been entered into the medical record, reviewed, and no changes needed.   Review of Systems: No fevers, chills, night sweats, weight loss, chest pain, or shortness of breath.   Objective:    General: Well Developed, well nourished, and in no acute distress.  Neuro: Alert and oriented x3, extra-ocular muscles intact, sensation grossly intact.  HEENT: Normocephalic, atraumatic, pupils equal round reactive to light, neck supple, no masses, no lymphadenopathy, thyroid nonpalpable.  Skin: Warm and dry, no rashes. Cardiac: Regular rate and rhythm, no murmurs rubs or gallops, no lower extremity edema.  Respiratory: Clear to auscultation bilaterally. Not using accessory muscles, speaking in full sentences. Left Knee: Normal to inspection with no erythema or effusion or obvious bony abnormalities. Tender to palpation at proximal patella and distal quads, only mild pain at patellar facets. ROM normal in flexion and extension and lower leg rotation. Ligaments with solid consistent endpoints including ACL, PCL, LCL, MCL. Negative Mcmurray's and provocative meniscal tests. Non painful patellar compression. Patellar and quadriceps tendons unremarkable. Hamstring and quadriceps strength is normal.  Impression and Recommendations:

## 2015-05-29 NOTE — Assessment & Plan Note (Signed)
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 Mild effusion. Reaction knee brace, meloxicam, rehabilitation exercises. Return in one month, we will consider injection if no better.

## 2015-06-25 ENCOUNTER — Other Ambulatory Visit: Payer: Self-pay | Admitting: Sports Medicine

## 2015-06-26 ENCOUNTER — Ambulatory Visit: Payer: 59 | Admitting: Sports Medicine

## 2015-06-28 ENCOUNTER — Other Ambulatory Visit: Payer: Self-pay | Admitting: Sports Medicine

## 2015-07-01 ENCOUNTER — Other Ambulatory Visit: Payer: Self-pay | Admitting: Sports Medicine

## 2015-07-15 ENCOUNTER — Encounter: Payer: Self-pay | Admitting: Obstetrics & Gynecology

## 2015-07-15 ENCOUNTER — Ambulatory Visit (INDEPENDENT_AMBULATORY_CARE_PROVIDER_SITE_OTHER): Payer: 59 | Admitting: Obstetrics & Gynecology

## 2015-07-15 VITALS — BP 146/83 | HR 76 | Resp 16 | Ht 64.0 in | Wt 137.0 lb

## 2015-07-15 DIAGNOSIS — Z01419 Encounter for gynecological examination (general) (routine) without abnormal findings: Secondary | ICD-10-CM | POA: Diagnosis not present

## 2015-07-15 DIAGNOSIS — Z113 Encounter for screening for infections with a predominantly sexual mode of transmission: Secondary | ICD-10-CM | POA: Diagnosis not present

## 2015-07-15 NOTE — Patient Instructions (Signed)
Preventive Care for Adults, Female A healthy lifestyle and preventive care can promote health and wellness. Preventive health guidelines for women include the following key practices.  A routine yearly physical is a good way to check with your health care provider about your health and preventive screening. It is a chance to share any concerns and updates on your health and to receive a thorough exam.  Visit your dentist for a routine exam and preventive care every 6 months. Brush your teeth twice a day and floss once a day. Good oral hygiene prevents tooth decay and gum disease.  The frequency of eye exams is based on your age, health, family medical history, use of contact lenses, and other factors. Follow your health care provider's recommendations for frequency of eye exams.  Eat a healthy diet. Foods like vegetables, fruits, whole grains, low-fat dairy products, and lean protein foods contain the nutrients you need without too many calories. Decrease your intake of foods high in solid fats, added sugars, and salt. Eat the right amount of calories for you.Get information about a proper diet from your health care provider, if necessary.  Regular physical exercise is one of the most important things you can do for your health. Most adults should get at least 150 minutes of moderate-intensity exercise (any activity that increases your heart rate and causes you to sweat) each week. In addition, most adults need muscle-strengthening exercises on 2 or more days a week.  Maintain a healthy weight. The body mass index (BMI) is a screening tool to identify possible weight problems. It provides an estimate of body fat based on height and weight. Your health care provider can find your BMI and can help you achieve or maintain a healthy weight.For adults 20 years and older:  A BMI below 18.5 is considered underweight.  A BMI of 18.5 to 24.9 is normal.  A BMI of 25 to 29.9 is considered overweight.  A  BMI of 30 and above is considered obese.  Maintain normal blood lipids and cholesterol levels by exercising and minimizing your intake of saturated fat. Eat a balanced diet with plenty of fruit and vegetables. Blood tests for lipids and cholesterol should begin at age 45 and be repeated every 5 years. If your lipid or cholesterol levels are high, you are over 50, or you are at high risk for heart disease, you may need your cholesterol levels checked more frequently.Ongoing high lipid and cholesterol levels should be treated with medicines if diet and exercise are not working.  If you smoke, find out from your health care provider how to quit. If you do not use tobacco, do not start.  Lung cancer screening is recommended for adults aged 45-80 years who are at high risk for developing lung cancer because of a history of smoking. A yearly low-dose CT scan of the lungs is recommended for people who have at least a 30-pack-year history of smoking and are a current smoker or have quit within the past 15 years. A pack year of smoking is smoking an average of 1 pack of cigarettes a day for 1 year (for example: 1 pack a day for 30 years or 2 packs a day for 15 years). Yearly screening should continue until the smoker has stopped smoking for at least 15 years. Yearly screening should be stopped for people who develop a health problem that would prevent them from having lung cancer treatment.  If you are pregnant, do not drink alcohol. If you are  breastfeeding, be very cautious about drinking alcohol. If you are not pregnant and choose to drink alcohol, do not have more than 1 drink per day. One drink is considered to be 12 ounces (355 mL) of beer, 5 ounces (148 mL) of wine, or 1.5 ounces (44 mL) of liquor.  Avoid use of street drugs. Do not share needles with anyone. Ask for help if you need support or instructions about stopping the use of drugs.  High blood pressure causes heart disease and increases the risk  of stroke. Your blood pressure should be checked at least every 1 to 2 years. Ongoing high blood pressure should be treated with medicines if weight loss and exercise do not work.  If you are 55-79 years old, ask your health care provider if you should take aspirin to prevent strokes.  Diabetes screening is done by taking a blood sample to check your blood glucose level after you have not eaten for a certain period of time (fasting). If you are not overweight and you do not have risk factors for diabetes, you should be screened once every 3 years starting at age 45. If you are overweight or obese and you are 40-70 years of age, you should be screened for diabetes every year as part of your cardiovascular risk assessment.  Breast cancer screening is essential preventive care for women. You should practice "breast self-awareness." This means understanding the normal appearance and feel of your breasts and may include breast self-examination. Any changes detected, no matter how small, should be reported to a health care provider. Women in their 20s and 30s should have a clinical breast exam (CBE) by a health care provider as part of a regular health exam every 1 to 3 years. After age 40, women should have a CBE every year. Starting at age 40, women should consider having a mammogram (breast X-ray test) every year. Women who have a family history of breast cancer should talk to their health care provider about genetic screening. Women at a high risk of breast cancer should talk to their health care providers about having an MRI and a mammogram every year.  Breast cancer gene (BRCA)-related cancer risk assessment is recommended for women who have family members with BRCA-related cancers. BRCA-related cancers include breast, ovarian, tubal, and peritoneal cancers. Having family members with these cancers may be associated with an increased risk for harmful changes (mutations) in the breast cancer genes BRCA1 and  BRCA2. Results of the assessment will determine the need for genetic counseling and BRCA1 and BRCA2 testing.  Your health care provider may recommend that you be screened regularly for cancer of the pelvic organs (ovaries, uterus, and vagina). This screening involves a pelvic examination, including checking for microscopic changes to the surface of your cervix (Pap test). You may be encouraged to have this screening done every 3 years, beginning at age 21.  For women ages 30-65, health care providers may recommend pelvic exams and Pap testing every 3 years, or they may recommend the Pap and pelvic exam, combined with testing for human papilloma virus (HPV), every 5 years. Some types of HPV increase your risk of cervical cancer. Testing for HPV may also be done on women of any age with unclear Pap test results.  Other health care providers may not recommend any screening for nonpregnant women who are considered low risk for pelvic cancer and who do not have symptoms. Ask your health care provider if a screening pelvic exam is right for   you.  If you have had past treatment for cervical cancer or a condition that could lead to cancer, you need Pap tests and screening for cancer for at least 20 years after your treatment. If Pap tests have been discontinued, your risk factors (such as having a new sexual partner) need to be reassessed to determine if screening should resume. Some women have medical problems that increase the chance of getting cervical cancer. In these cases, your health care provider may recommend more frequent screening and Pap tests.  Colorectal cancer can be detected and often prevented. Most routine colorectal cancer screening begins at the age of 50 years and continues through age 75 years. However, your health care provider may recommend screening at an earlier age if you have risk factors for colon cancer. On a yearly basis, your health care provider may provide home test kits to check  for hidden blood in the stool. Use of a small camera at the end of a tube, to directly examine the colon (sigmoidoscopy or colonoscopy), can detect the earliest forms of colorectal cancer. Talk to your health care provider about this at age 50, when routine screening begins. Direct exam of the colon should be repeated every 5-10 years through age 75 years, unless early forms of precancerous polyps or small growths are found.  People who are at an increased risk for hepatitis B should be screened for this virus. You are considered at high risk for hepatitis B if:  You were born in a country where hepatitis B occurs often. Talk with your health care provider about which countries are considered high risk.  Your parents were born in a high-risk country and you have not received a shot to protect against hepatitis B (hepatitis B vaccine).  You have HIV or AIDS.  You use needles to inject street drugs.  You live with, or have sex with, someone who has hepatitis B.  You get hemodialysis treatment.  You take certain medicines for conditions like cancer, organ transplantation, and autoimmune conditions.  Hepatitis C blood testing is recommended for all people born from 1945 through 1965 and any individual with known risks for hepatitis C.  Practice safe sex. Use condoms and avoid high-risk sexual practices to reduce the spread of sexually transmitted infections (STIs). STIs include gonorrhea, chlamydia, syphilis, trichomonas, herpes, HPV, and human immunodeficiency virus (HIV). Herpes, HIV, and HPV are viral illnesses that have no cure. They can result in disability, cancer, and death.  You should be screened for sexually transmitted illnesses (STIs) including gonorrhea and chlamydia if:  You are sexually active and are younger than 24 years.  You are older than 24 years and your health care provider tells you that you are at risk for this type of infection.  Your sexual activity has changed  since you were last screened and you are at an increased risk for chlamydia or gonorrhea. Ask your health care provider if you are at risk.  If you are at risk of being infected with HIV, it is recommended that you take a prescription medicine daily to prevent HIV infection. This is called preexposure prophylaxis (PrEP). You are considered at risk if:  You are sexually active and do not regularly use condoms or know the HIV status of your partner(s).  You take drugs by injection.  You are sexually active with a partner who has HIV.  Talk with your health care provider about whether you are at high risk of being infected with HIV. If   you choose to begin PrEP, you should first be tested for HIV. You should then be tested every 3 months for as long as you are taking PrEP.  Osteoporosis is a disease in which the bones lose minerals and strength with aging. This can result in serious bone fractures or breaks. The risk of osteoporosis can be identified using a bone density scan. Women ages 67 years and over and women at risk for fractures or osteoporosis should discuss screening with their health care providers. Ask your health care provider whether you should take a calcium supplement or vitamin D to reduce the rate of osteoporosis.  Menopause can be associated with physical symptoms and risks. Hormone replacement therapy is available to decrease symptoms and risks. You should talk to your health care provider about whether hormone replacement therapy is right for you.  Use sunscreen. Apply sunscreen liberally and repeatedly throughout the day. You should seek shade when your shadow is shorter than you. Protect yourself by wearing long sleeves, pants, a wide-brimmed hat, and sunglasses year round, whenever you are outdoors.  Once a month, do a whole body skin exam, using a mirror to look at the skin on your back. Tell your health care provider of new moles, moles that have irregular borders, moles that  are larger than a pencil eraser, or moles that have changed in shape or color.  Stay current with required vaccines (immunizations).  Influenza vaccine. All adults should be immunized every year.  Tetanus, diphtheria, and acellular pertussis (Td, Tdap) vaccine. Pregnant women should receive 1 dose of Tdap vaccine during each pregnancy. The dose should be obtained regardless of the length of time since the last dose. Immunization is preferred during the 27th-36th week of gestation. An adult who has not previously received Tdap or who does not know her vaccine status should receive 1 dose of Tdap. This initial dose should be followed by tetanus and diphtheria toxoids (Td) booster doses every 10 years. Adults with an unknown or incomplete history of completing a 3-dose immunization series with Td-containing vaccines should begin or complete a primary immunization series including a Tdap dose. Adults should receive a Td booster every 10 years.  Varicella vaccine. An adult without evidence of immunity to varicella should receive 2 doses or a second dose if she has previously received 1 dose. Pregnant females who do not have evidence of immunity should receive the first dose after pregnancy. This first dose should be obtained before leaving the health care facility. The second dose should be obtained 4-8 weeks after the first dose.  Human papillomavirus (HPV) vaccine. Females aged 13-26 years who have not received the vaccine previously should obtain the 3-dose series. The vaccine is not recommended for use in pregnant females. However, pregnancy testing is not needed before receiving a dose. If a female is found to be pregnant after receiving a dose, no treatment is needed. In that case, the remaining doses should be delayed until after the pregnancy. Immunization is recommended for any person with an immunocompromised condition through the age of 61 years if she did not get any or all doses earlier. During the  3-dose series, the second dose should be obtained 4-8 weeks after the first dose. The third dose should be obtained 24 weeks after the first dose and 16 weeks after the second dose.  Zoster vaccine. One dose is recommended for adults aged 30 years or older unless certain conditions are present.  Measles, mumps, and rubella (MMR) vaccine. Adults born  before 1957 generally are considered immune to measles and mumps. Adults born in 1957 or later should have 1 or more doses of MMR vaccine unless there is a contraindication to the vaccine or there is laboratory evidence of immunity to each of the three diseases. A routine second dose of MMR vaccine should be obtained at least 28 days after the first dose for students attending postsecondary schools, health care workers, or international travelers. People who received inactivated measles vaccine or an unknown type of measles vaccine during 1963-1967 should receive 2 doses of MMR vaccine. People who received inactivated mumps vaccine or an unknown type of mumps vaccine before 1979 and are at high risk for mumps infection should consider immunization with 2 doses of MMR vaccine. For females of childbearing age, rubella immunity should be determined. If there is no evidence of immunity, females who are not pregnant should be vaccinated. If there is no evidence of immunity, females who are pregnant should delay immunization until after pregnancy. Unvaccinated health care workers born before 1957 who lack laboratory evidence of measles, mumps, or rubella immunity or laboratory confirmation of disease should consider measles and mumps immunization with 2 doses of MMR vaccine or rubella immunization with 1 dose of MMR vaccine.  Pneumococcal 13-valent conjugate (PCV13) vaccine. When indicated, a person who is uncertain of his immunization history and has no record of immunization should receive the PCV13 vaccine. All adults 65 years of age and older should receive this  vaccine. An adult aged 19 years or older who has certain medical conditions and has not been previously immunized should receive 1 dose of PCV13 vaccine. This PCV13 should be followed with a dose of pneumococcal polysaccharide (PPSV23) vaccine. Adults who are at high risk for pneumococcal disease should obtain the PPSV23 vaccine at least 8 weeks after the dose of PCV13 vaccine. Adults older than 59 years of age who have normal immune system function should obtain the PPSV23 vaccine dose at least 1 year after the dose of PCV13 vaccine.  Pneumococcal polysaccharide (PPSV23) vaccine. When PCV13 is also indicated, PCV13 should be obtained first. All adults aged 65 years and older should be immunized. An adult younger than age 65 years who has certain medical conditions should be immunized. Any person who resides in a nursing home or long-term care facility should be immunized. An adult smoker should be immunized. People with an immunocompromised condition and certain other conditions should receive both PCV13 and PPSV23 vaccines. People with human immunodeficiency virus (HIV) infection should be immunized as soon as possible after diagnosis. Immunization during chemotherapy or radiation therapy should be avoided. Routine use of PPSV23 vaccine is not recommended for American Indians, [place] Natives, or people younger than 65 years unless there are medical conditions that require PPSV23 vaccine. When indicated, people who have unknown immunization and have no record of immunization should receive PPSV23 vaccine. One-time revaccination 5 years after the first dose of PPSV23 is recommended for people aged 19-64 years who have chronic kidney failure, nephrotic syndrome, asplenia, or immunocompromised conditions. People who received 1-2 doses of PPSV23 before age 65 years should receive another dose of PPSV23 vaccine at age 65 years or later if at least 5 years have passed since the previous dose. Doses of PPSV23 are not  needed for people immunized with PPSV23 at or after age 65 years.  Meningococcal vaccine. Adults with asplenia or persistent complement component deficiencies should receive 2 doses of quadrivalent meningococcal conjugate (MenACWY-D) vaccine. The doses should be obtained   at least 2 months apart. Microbiologists working with certain meningococcal bacteria, [place] recruits, people at risk during an outbreak, and people who travel to or live in countries with a high rate of meningitis should be immunized. A first-year college student up through age 34 years who is living in a residence hall should receive a dose if she did not receive a dose on or after her 16th birthday. Adults who have certain high-risk conditions should receive one or more doses of vaccine.  Hepatitis A vaccine. Adults who wish to be protected from this disease, have certain high-risk conditions, work with hepatitis A-infected animals, work in hepatitis A research labs, or travel to or work in countries with a high rate of hepatitis A should be immunized. Adults who were previously unvaccinated and who anticipate close contact with an international adoptee during the first 60 days after arrival in the [place] from a country with a high rate of hepatitis A should be immunized.  Hepatitis B vaccine. Adults who wish to be protected from this disease, have certain high-risk conditions, may be exposed to blood or other infectious body fluids, are household contacts or sex partners of hepatitis B positive people, are clients or workers in certain care facilities, or travel to or work in countries with a high rate of hepatitis B should be immunized.  Haemophilus influenzae type b (Hib) vaccine. A previously unvaccinated person with asplenia or sickle cell disease or having a scheduled splenectomy should receive 1 dose of Hib vaccine. Regardless of previous immunization, a recipient of a hematopoietic stem cell transplant should receive a  3-dose series 6-12 months after her successful transplant. Hib vaccine is not recommended for adults with HIV infection. Preventive Services / Frequency Ages 35 to 4 years  Blood pressure check.** / Every 3-5 years.  Lipid and cholesterol check.** / Every 5 years beginning at age 60.  Clinical breast exam.** / Every 3 years for women in their 71s and 10s.  BRCA-related cancer risk assessment.** / For women who have family members with a BRCA-related cancer (breast, ovarian, tubal, or peritoneal cancers).  Pap test.** / Every 2 years from ages 76 through 26. Every 3 years starting at age 61 through age 76 or 93 with a history of 3 consecutive normal Pap tests.  HPV screening.** / Every 3 years from ages 37 through ages 60 to 51 with a history of 3 consecutive normal Pap tests.  Hepatitis C blood test.** / For any individual with known risks for hepatitis C.  Skin self-exam. / Monthly.  Influenza vaccine. / Every year.  Tetanus, diphtheria, and acellular pertussis (Tdap, Td) vaccine.** / Consult your health care provider. Pregnant women should receive 1 dose of Tdap vaccine during each pregnancy. 1 dose of Td every 10 years.  Varicella vaccine.** / Consult your health care provider. Pregnant females who do not have evidence of immunity should receive the first dose after pregnancy.  HPV vaccine. / 3 doses over 6 months, if 93 and younger. The vaccine is not recommended for use in pregnant females. However, pregnancy testing is not needed before receiving a dose.  Measles, mumps, rubella (MMR) vaccine.** / You need at least 1 dose of MMR if you were born in 1957 or later. You may also need a 2nd dose. For females of childbearing age, rubella immunity should be determined. If there is no evidence of immunity, females who are not pregnant should be vaccinated. If there is no evidence of immunity, females who are  pregnant should delay immunization until after pregnancy.  Pneumococcal  13-valent conjugate (PCV13) vaccine.** / Consult your health care provider.  Pneumococcal polysaccharide (PPSV23) vaccine.** / 1 to 2 doses if you smoke cigarettes or if you have certain conditions.  Meningococcal vaccine.** / 1 dose if you are age 68 to 8 years and a Market researcher living in a residence hall, or have one of several medical conditions, you need to get vaccinated against meningococcal disease. You may also need additional booster doses.  Hepatitis A vaccine.** / Consult your health care provider.  Hepatitis B vaccine.** / Consult your health care provider.  Haemophilus influenzae type b (Hib) vaccine.** / Consult your health care provider. Ages 7 to 53 years  Blood pressure check.** / Every year.  Lipid and cholesterol check.** / Every 5 years beginning at age 25 years.  Lung cancer screening. / Every year if you are aged 11-80 years and have a 30-pack-year history of smoking and currently smoke or have quit within the past 15 years. Yearly screening is stopped once you have quit smoking for at least 15 years or develop a health problem that would prevent you from having lung cancer treatment.  Clinical breast exam.** / Every year after age 48 years.  BRCA-related cancer risk assessment.** / For women who have family members with a BRCA-related cancer (breast, ovarian, tubal, or peritoneal cancers).  Mammogram.** / Every year beginning at age 41 years and continuing for as long as you are in good health. Consult with your health care provider.  Pap test.** / Every 3 years starting at age 65 years through age 37 or 70 years with a history of 3 consecutive normal Pap tests.  HPV screening.** / Every 3 years from ages 72 years through ages 60 to 40 years with a history of 3 consecutive normal Pap tests.  Fecal occult blood test (FOBT) of stool. / Every year beginning at age 21 years and continuing until age 5 years. You may not need to do this test if you get  a colonoscopy every 10 years.  Flexible sigmoidoscopy or colonoscopy.** / Every 5 years for a flexible sigmoidoscopy or every 10 years for a colonoscopy beginning at age 35 years and continuing until age 48 years.  Hepatitis C blood test.** / For all people born from [birth year] through 1965 and any individual with known risks for hepatitis C.  Skin self-exam. / Monthly.  Influenza vaccine. / Every year.  Tetanus, diphtheria, and acellular pertussis (Tdap/Td) vaccine.** / Consult your health care provider. Pregnant women should receive 1 dose of Tdap vaccine during each pregnancy. 1 dose of Td every 10 years.  Varicella vaccine.** / Consult your health care provider. Pregnant females who do not have evidence of immunity should receive the first dose after pregnancy.  Zoster vaccine.** / 1 dose for adults aged 30 years or older.  Measles, mumps, rubella (MMR) vaccine.** / You need at least 1 dose of MMR if you were born in 1957 or later. You may also need a second dose. For females of childbearing age, rubella immunity should be determined. If there is no evidence of immunity, females who are not pregnant should be vaccinated. If there is no evidence of immunity, females who are pregnant should delay immunization until after pregnancy.  Pneumococcal 13-valent conjugate (PCV13) vaccine.** / Consult your health care provider.  Pneumococcal polysaccharide (PPSV23) vaccine.** / 1 to 2 doses if you smoke cigarettes or if you have certain conditions.  Meningococcal vaccine.** /  Consult your health care provider.  Hepatitis A vaccine.** / Consult your health care provider.  Hepatitis B vaccine.** / Consult your health care provider.  Haemophilus influenzae type b (Hib) vaccine.** / Consult your health care provider. Ages 64 years and over  Blood pressure check.** / Every year.  Lipid and cholesterol check.** / Every 5 years beginning at age 23 years.  Lung cancer screening. / Every year if you  are aged 16-80 years and have a 30-pack-year history of smoking and currently smoke or have quit within the past 15 years. Yearly screening is stopped once you have quit smoking for at least 15 years or develop a health problem that would prevent you from having lung cancer treatment.  Clinical breast exam.** / Every year after age 74 years.  BRCA-related cancer risk assessment.** / For women who have family members with a BRCA-related cancer (breast, ovarian, tubal, or peritoneal cancers).  Mammogram.** / Every year beginning at age 44 years and continuing for as long as you are in good health. Consult with your health care provider.  Pap test.** / Every 3 years starting at age 58 years through age 22 or 39 years with 3 consecutive normal Pap tests. Testing can be stopped between 65 and 70 years with 3 consecutive normal Pap tests and no abnormal Pap or HPV tests in the past 10 years.  HPV screening.** / Every 3 years from ages 64 years through ages 70 or 61 years with a history of 3 consecutive normal Pap tests. Testing can be stopped between 65 and 70 years with 3 consecutive normal Pap tests and no abnormal Pap or HPV tests in the past 10 years.  Fecal occult blood test (FOBT) of stool. / Every year beginning at age 40 years and continuing until age 27 years. You may not need to do this test if you get a colonoscopy every 10 years.  Flexible sigmoidoscopy or colonoscopy.** / Every 5 years for a flexible sigmoidoscopy or every 10 years for a colonoscopy beginning at age 7 years and continuing until age 32 years.  Hepatitis C blood test.** / For all people born from [birth year] through 1965 and any individual with known risks for hepatitis C.  Osteoporosis screening.** / A one-time screening for women ages 30 years and over and women at risk for fractures or osteoporosis.  Skin self-exam. / Monthly.  Influenza vaccine. / Every year.  Tetanus, diphtheria, and acellular pertussis (Tdap/Td)  vaccine.** / 1 dose of Td every 10 years.  Varicella vaccine.** / Consult your health care provider.  Zoster vaccine.** / 1 dose for adults aged 35 years or older.  Pneumococcal 13-valent conjugate (PCV13) vaccine.** / Consult your health care provider.  Pneumococcal polysaccharide (PPSV23) vaccine.** / 1 dose for all adults aged 46 years and older.  Meningococcal vaccine.** / Consult your health care provider.  Hepatitis A vaccine.** / Consult your health care provider.  Hepatitis B vaccine.** / Consult your health care provider.  Haemophilus influenzae type b (Hib) vaccine.** / Consult your health care provider. ** Family history and personal history of risk and conditions may change your health care provider's recommendations.   This information is not intended to replace advice given to you by your health care provider. Make sure you discuss any questions you have with your health care provider.   Document Released: 09/07/2001 Document Revised: 08/02/2014 Document Reviewed: 12/07/2010 Elsevier Interactive Patient Education [employer].

## 2015-07-15 NOTE — Progress Notes (Signed)
GYNECOLOGY CLINIC ANNUAL PREVENTATIVE CARE ENCOUNTER NOTE  Subjective:   Katherine Lawson is a 59 y.o. 152P2002 female here for a routine annual gynecologic exam.  She is s/p hysterectomy several years ago for benign indications; ovaries are in place.  Current complaints: recently had unprotected intercourse with a man and found out he had several other partners.  Desires STI screen.  Also reports constant abdominal tenderness especially on her right side after undergoing a thermal sculpting procedure for her abdominal adipose tissue in 05/2015. She did follow up with the Lorrinda Ramstad, feels that a nerve was impacted during the procedure.  Denies abnormal vaginal bleeding, discharge, pelvic pain, problems with intercourse or other gynecologic concerns.    Gynecologic History No LMP recorded. Patient is postmenopausal. Contraception: status post hysterectomy Last Pap: over three years ago. Results were: normal Last mammogram: 02/2015. Results were: normal  Obstetric History OB History  Gravida Para Term Preterm AB SAB TAB Ectopic Multiple Living  2 2 2       2     # Outcome Date GA Lbr Len/2nd Weight Sex Delivery Anes PTL Lv  2 Term      Vag-Spont     1 Term      Vag-Spont         Past Medical History  Diagnosis Date  . Hx of breast implants, bilateral 1990  . S/P partial hysterectomy 1980  . Barrett's esophagus   . Arthritis   . Anxiety   . Hiatal hernia     Past Surgical History  Procedure Laterality Date  . Abdominal hysterectomy    . Breast enhancement surgery  1989  . Nose surgery    . Wrist surgery    . Cholecystectomy    . Sculpting      Thermal sculpting of abdominal adipose tissue    Current Outpatient Prescriptions on File Prior to Visit  Medication Sig Dispense Refill  . alprazolam (XANAX) 2 MG tablet TAKE 1 TABLET BY MOUTH DAILY AS NEEDED FOR SLEEP. 40 tablet 0  . atorvastatin (LIPITOR) 40 MG tablet TAKE 1 TABLET BY MOUTH ONCE DAILY 90 tablet 0  . conjugated  estrogens (PREMARIN) vaginal cream Apply daily for 2 weeks then 3 times per week. 30 g 12  . DEXILANT 60 MG capsule TAKE 1 CAPSULE BY MOUTH DAILY 90 capsule 0  . Estradiol 6 MG PLLT by Implant route.    . meloxicam (MOBIC) 15 MG tablet Take 1 tablet (15 mg total) by mouth daily. 90 tablet 3  . nitroGLYCERIN (NITROSTAT) 0.4 MG SL tablet Place 1 tablet (0.4 mg total) under the tongue every 5 (five) minutes as needed for chest pain (or tightness). 30 tablet 0  . Testosterone 25 MG PLLT by Implant route.    . topiramate (TOPAMAX) 100 MG tablet Take 1 tablet (100 mg total) by mouth daily. 30 tablet 11  . triamcinolone cream (KENALOG) 0.1 % Apply 1 application topically 2 (two) times daily. 30 g 0  . aspirin EC 81 MG tablet Take 1 tablet (81 mg total) by mouth daily. (Patient not taking: Reported on 07/15/2015) 90 tablet 3  . Eszopiclone 3 MG TABS TAKE 1 TABLET(3MG  TOTAL) BY MOUTH AT BEDTIME. TAKE IMMEDIATELY BEFORE BEDTIME. 30 tablet 2  . phentermine (ADIPEX-P) 37.5 MG tablet One tab by mouth qAM (Patient not taking: Reported on 07/15/2015) 30 tablet 0   No current facility-administered medications on file prior to visit.    Allergies  Allergen Reactions  . Codeine  Social History   Social History  . Marital Status: Widowed    Spouse Name: N/A  . Number of Children: N/A  . Years of Education: N/A   Occupational History  . volunteer    Social History Main Topics  . Smoking status: Never Smoker   . Smokeless tobacco: Never Used  . Alcohol Use: 0.0 oz/week    0 Standard drinks or equivalent per week  . Drug Use: No  . Sexual Activity:    Partners: Male   Other Topics Concern  . Not on file   Social History Narrative    Family History  Problem Relation Age of Onset  . Cancer Mother     melanoma  . Cancer Father     melonoma,bone  . Diabetes Mother   . Diabetes Father   . Cancer Maternal Grandmother     breast    The following portions of the patient's history were  reviewed and updated as appropriate: allergies, current medications, past family history, past medical history, past social history, past surgical history and problem list.  Review of Systems Pertinent items noted in HPI and remainder of comprehensive ROS otherwise negative.   Objective:  BP 146/83 mmHg  Pulse 76  Resp 16  Ht  (1.626 m)  Wt 137 lb (62.143 kg)  BMI 23.50 kg/m2 CONSTITUTIONAL: Well-developed, well-nourished female in no acute distress.  HENT:  Normocephalic, atraumatic, External right and left ear normal. Oropharynx is clear and moist EYES: Conjunctivae and EOM are normal. Pupils are equal, round, and reactive to light. No scleral icterus.  NECK: Normal range of motion, supple, no masses.  Normal thyroid.  SKIN: Skin is warm and dry. No rash noted. Not diaphoretic. No erythema. No pallor. NEUROLGIC: Alert and oriented to person, place, and time. Normal reflexes, muscle tone coordination. No cranial nerve deficit noted. PSYCHIATRIC: Normal mood and affect. Normal behavior. Normal judgment and thought content. CARDIOVASCULAR: Normal heart rate noted, regular rhythm RESPIRATORY: Clear to auscultation bilaterally. Effort and breath sounds normal, no problems with respiration noted. BREASTS: Symmetric in size, implants in place. No masses, skin changes, nipple drainage, or lymphadenopathy. ABDOMEN: Soft, normal bowel sounds, no distention noted. Some subcutaneous nodules palpated in abdominal adipose tissue, some tenderness on her right side. No erythema, no other tenderness, rebound or guarding.  PELVIC: Normal appearing external genitalia; normal appearing vaginal mucosa and vaginal cuff.  Copious amount of white cream noted in vagina, patient reported using Premarin cream. Unable to get wet prep or other sample.  Adnexa not palpated, no adnexal tenderness. MUSCULOSKELETAL: Normal range of motion. No tenderness.  No cyanosis, clubbing, or edema.  2+ distal  pulses.   Assessment:  Annual gynecologic examination STI screen ordered Pain after abdominal sculpting procedure   Plan:  Explained to patient that any nerve injury during her procedure can take months for full recovery.  Recommended warm compresses, NSAIDS as needed. Nodules palpated are scar tissue after procedure and should hopefully soften with time. STI screen done, will follow up results and manage accordingly. Routine preventative health maintenance measures emphasized. Please refer to After Visit Summary for other counseling recommendations.    Jaynie Collins, MD, FACOG Attending Obstetrician & Gynecologist, [facility]

## 2015-07-16 LAB — HEPATITIS C ANTIBODY: HCV AB: NEGATIVE

## 2015-07-16 LAB — HIV ANTIBODY (ROUTINE TESTING W REFLEX): HIV 1&2 Ab, 4th Generation: NONREACTIVE

## 2015-07-16 LAB — HEPATITIS B SURFACE ANTIGEN: Hepatitis B Surface Ag: NEGATIVE

## 2015-07-16 LAB — RPR

## 2015-07-17 ENCOUNTER — Telehealth: Payer: Self-pay | Admitting: *Deleted

## 2015-07-17 LAB — URINE CYTOLOGY ANCILLARY ONLY
Chlamydia: NEGATIVE
Neisseria Gonorrhea: NEGATIVE
Trichomonas: NEGATIVE

## 2015-07-17 NOTE — Telephone Encounter (Signed)
Erroneous

## 2015-08-13 ENCOUNTER — Other Ambulatory Visit: Payer: Self-pay | Admitting: Sports Medicine

## 2015-08-27 ENCOUNTER — Other Ambulatory Visit: Payer: Self-pay

## 2015-08-27 MED ORDER — DEXLANSOPRAZOLE 60 MG PO CPDR: 1.0000 | DELAYED_RELEASE_CAPSULE | Freq: Every day | ORAL | Status: DC

## 2015-09-01 ENCOUNTER — Telehealth: Payer: Self-pay | Admitting: Sports Medicine

## 2015-09-01 NOTE — Telephone Encounter (Signed)
Received fax from [facility] and they have denied coverage on Dexilant due to patient must try 2 alternatives on the formulary first. Reference [id number] - CF

## 2015-09-03 ENCOUNTER — Telehealth: Payer: Self-pay | Admitting: Sports Medicine

## 2015-09-03 NOTE — Telephone Encounter (Signed)
Resubmitted prior authorization on Dexilant through cover my meds waiting on authorization. - CF

## 2015-09-10 NOTE — Telephone Encounter (Signed)
Received fax from [facility] and Dexilant is approved from 08/28/15 - 07/25/2038. Reference # [id number] - pharmacy notified - CF

## 2015-09-26 ENCOUNTER — Other Ambulatory Visit: Payer: Self-pay | Admitting: Sports Medicine

## 2015-10-05 ENCOUNTER — Other Ambulatory Visit: Payer: Self-pay | Admitting: Sports Medicine

## 2015-10-09 ENCOUNTER — Ambulatory Visit (INDEPENDENT_AMBULATORY_CARE_PROVIDER_SITE_OTHER): Payer: BLUE CROSS/BLUE SHIELD | Admitting: Sports Medicine

## 2015-10-09 ENCOUNTER — Encounter: Payer: Self-pay | Admitting: Sports Medicine

## 2015-10-09 VITALS — BP 146/80 | HR 71 | Resp 16 | Wt 131.4 lb

## 2015-10-09 DIAGNOSIS — M545 Low back pain, unspecified: Secondary | ICD-10-CM

## 2015-10-09 DIAGNOSIS — Z299 Encounter for prophylactic measures, unspecified: Secondary | ICD-10-CM | POA: Diagnosis not present

## 2015-10-09 DIAGNOSIS — E663 Overweight: Secondary | ICD-10-CM

## 2015-10-09 DIAGNOSIS — E785 Hyperlipidemia, unspecified: Secondary | ICD-10-CM

## 2015-10-09 DIAGNOSIS — J209 Acute bronchitis, unspecified: Secondary | ICD-10-CM

## 2015-10-09 MED ORDER — AZITHROMYCIN 250 MG PO TABS: ORAL_TABLET | ORAL | Status: DC

## 2015-10-09 MED ORDER — PHENTERMINE HCL 37.5 MG PO TABS: ORAL_TABLET | ORAL | Status: DC

## 2015-10-09 NOTE — Assessment & Plan Note (Signed)
Present for 2 weeks, adding azithromycin

## 2015-10-09 NOTE — Assessment & Plan Note (Signed)
L4-L5 degenerative disc disease, pain is referable to the left SI joint today. Formal physical therapy for one month, if no better we will inject it.

## 2015-10-09 NOTE — Progress Notes (Signed)
  Subjective:    CC: follow-up  HPI: Hyperlipidemia: Due for blood work, needs a refill on atorvastatin  Coughing: Present for 2 weeks, minimally productive, no fevers, chills, night sweats, or shortness of breath, no chest pain.  Back pain: Left sacroiliac joint, worse with working out, history of L4-L5 degenerative changes on x-ray, pain is moderate, persistent without radiation.  Preventive measures: Due for colonoscopy, up-to-date on cervical cancer screen, desires bone densitometry.  Past medical history, Surgical history, Family history not pertinant except as noted below, Social history, Allergies, and medications have been entered into the medical record, reviewed, and no changes needed.   Review of Systems: No fevers, chills, night sweats, weight loss, chest pain, or shortness of breath.   Objective:    General: Well Developed, well nourished, and in no acute distress.  Neuro: Alert and oriented x3, extra-ocular muscles intact, sensation grossly intact.  HEENT: Normocephalic, atraumatic, pupils equal round reactive to light, neck supple, no masses, no lymphadenopathy, thyroid nonpalpable.  Skin: Warm and dry, no rashes. Cardiac: Regular rate and rhythm, no murmurs rubs or gallops, no lower extremity edema.  Respiratory: Clear to auscultation bilaterally. Not using accessory muscles, speaking in full sentences. Back Exam:  Inspection: Unremarkable  Motion: Flexion 45 deg, Extension 45 deg, Side Bending to 45 deg bilaterally,  Rotation to 45 deg bilaterally  SLR laying: Negative  XSLR laying: Negative  Palpable tenderness: left sacroiliac joint. FABER: negative. Sensory change: Gross sensation intact to all lumbar and sacral dermatomes.  Reflexes: 2+ at both patellar tendons, 2+ at achilles tendons, Babinski's downgoing.  Strength at foot  Plantar-flexion: 5/5 Dorsi-flexion: 5/5 Eversion: 5/5 Inversion: 5/5  Leg strength  Quad: 5/5 Hamstring: 5/5 Hip flexor: 5/5 Hip  abductors: 5/5  Gait unremarkable.  Impression and Recommendations:   I spent 40 minutes with this patient, greater than 50% was face-to-face time counseling regarding the above diagnoses

## 2015-10-09 NOTE — Assessment & Plan Note (Signed)
Refilling atorvastatin, rechecking blood work.

## 2015-10-09 NOTE — Assessment & Plan Note (Signed)
Bone densitometry, referral for colonoscopy.

## 2015-10-09 NOTE — Assessment & Plan Note (Signed)
Restarting phentermine per patient request. Return monthly for weight checks and refills. 

## 2015-10-16 ENCOUNTER — Encounter: Payer: Self-pay | Admitting: Physical Therapy

## 2015-10-16 ENCOUNTER — Ambulatory Visit (INDEPENDENT_AMBULATORY_CARE_PROVIDER_SITE_OTHER): Payer: BLUE CROSS/BLUE SHIELD | Admitting: Physical Therapy

## 2015-10-16 DIAGNOSIS — M545 Low back pain, unspecified: Secondary | ICD-10-CM

## 2015-10-16 DIAGNOSIS — R531 Weakness: Secondary | ICD-10-CM | POA: Diagnosis not present

## 2015-10-16 NOTE — Therapy (Signed)
[facility] [address] [place], [place], [address] Phone: [phone]   Fax:  [phone]  Physical Therapy Evaluation  Patient Details  Name: Katherine Lawson MRN: [id number] Date of Birth: 11/28/1955 Referring Provider: Dr Benjamin Stainhekkekandam  Encounter Date: 10/16/2015      PT End of Session - 10/16/15 1105    Visit Number 1   Number of Visits 12   Date for PT Re-Evaluation 11/27/15   PT Start Time 1106   PT Stop Time 1213   PT Time Calculation (min) 67 min   Activity Tolerance Patient tolerated treatment well      Past Medical History  Diagnosis Date  . Hx of breast implants, bilateral 1990  . S/P partial hysterectomy 1980  . Barrett's esophagus   . Arthritis   . Anxiety   . Hiatal hernia     Past Surgical History  Procedure Laterality Date  . Abdominal hysterectomy    . Breast enhancement surgery  1989  . Nose surgery    . Wrist surgery    . Cholecystectomy    . Sculpting      Thermal sculpting of abdominal adipose tissue    There were no vitals filed for this visit.  Visit Diagnosis:  Left-sided low back pain without sciatica - Plan: PT plan of care cert/re-cert  Weakness generalized - Plan: PT plan of care cert/re-cert      Subjective Assessment - 10/16/15 1106    Subjective Pt reports constant pain in the Lt SIJ, has a h/o a disc being out and DDD along with arthritis.  Was working with a Systems analystpersonal trainer, has stopped for the last 3 weeks due to a feeling of bone/bone rubbing. Saw MD for her annual physical and reported  her sympotms and he suggested she see PT to learn the right exercise.    Pertinent History arthitis in her knees    How long can you sit comfortably? it hurts constant, she pushes through and does what she has to do.    Patient Stated Goals decrease pain or have it go away, strengthen abdominals and back to allow her to continue to walk Not need surgery on her back.    Currently in  Pain? Yes   Pain Score 8    Pain Location Back   Pain Orientation Left   Pain Descriptors / Indicators Sharp   Pain Type Acute pain   Pain Onset 1 to 4 weeks ago   Pain Frequency Constant   Aggravating Factors  will wake her up at night. constant pain   Pain Relieving Factors nothing she has found            [facility] PT Assessment - 10/16/15 0001    Assessment   Medical Diagnosis Lt side LBP   Referring Provider Dr Benjamin Stainhekkekandam   Onset Date/Surgical Date 09/25/15   Hand Dominance Right   Next MD Visit PRN   Prior Therapy none   Precautions   Precautions None   Balance Screen   Has the patient fallen in the past 6 months No   Has the patient had a decrease in activity level because of a fear of falling?  No   Is the patient reluctant to leave their home because of a fear of falling?  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    Posture/Postural Control   Posture/Postural Control Postural limitations   Postural Limitations Rounded Shoulders;Forward head;Decreased lumbar lordosis   ROM / Strength   AROM / PROM / Strength AROM;Strength   AROM   Overall AROM Comments LE's WNL   AROM Assessment Site Lumbar   Lumbar Flexion WNL   Lumbar Extension WNL   Lumbar - Right Side Bend WNL  pain in Lt side   Lumbar - Left Side Bend WNL   Lumbar - Right Rotation WNL   Lumbar - Left Rotation WNL  pain in Lt low back   Strength   Strength Assessment Site Lumbar;Hip;Knee    Right/Left Hip Left;Right   Right Hip Flexion 5/5   Right Hip Extension --  5-/5   Right Hip ABduction 5/5   Left Hip Flexion 5/5   Left Hip Extension 4+/5   Left Hip ABduction 4+/5   Right/Left Knee Right;Left   Right Knee Flexion 5/5   Right Knee Extension 5/5   Left Knee Flexion 4+/5   Left Knee Extension 4+/5   Lumbar Flexion --  TA fair    Lumbar Extension --  multifidi Lt fair, Rt good   Palpation   Spinal mobility hypomobile Lt UPA lumbar and CPA mobs   Palpation comment very tight in bilat lumbar paraspinals, and Lt gluts, piriformis   Special Tests    Special Tests Lumbar   Lumbar Tests Slump Test   Slump test   Findings Positive   Side Left                   [facility] Adult PT Treatment/Exercise - 10/16/15 0001    Exercises   Exercises Lumbar   Lumbar Exercises: Stretches   Double Knee to Chest Stretch --  childs pose with knees wide   Lower Trunk Rotation 1 rep;30 seconds  with bottom leg straight   Quadruped Mid Back Stretch --  10 reps cat/cow   ITB Stretch --  cross body stretch with strap   Modalities   Modalities Electrical Stimulation;Moist Heat   Moist Heat Therapy   Number Minutes Moist Heat 15 Minutes   Moist Heat Location Lumbar Spine  and buttocks   Electrical Stimulation   Electrical Stimulation Location lumbar and buttocks   Electrical Stimulation Action IFC   Electrical Stimulation Parameters to tolerance   Electrical Stimulation Goals Pain;Tone   Manual Therapy   Manual Therapy Joint mobilization   Joint Mobilization grade III Lt UPA L5-3                PT Education - 10/16/15 1149    Education provided Yes   Education Details HEP and TENs   Person(s) Educated Patient   Methods Explanation;Demonstration;Handout   Comprehension Returned demonstration;Verbalized understanding             PT Long Term Goals - 10/16/15 1219    PT LONG TERM GOAL #1   Title I with advanced HEP ( 11/27/15)    Time 6   Period  Weeks   Status New   PT  LONG TERM GOAL #2   Title demo strong and equal contraction of lumbar multifidi ( 11/27/15)    Time 6   Period Weeks   Status New   PT LONG TERM GOAL #3   Title report =/> 75% reduction in Lt side LBP ( 11/27/15)    Time 6   Period Weeks   Status New   PT LONG TERM GOAL #4   Title improve hip strength =/> 5-/5 ( 11/27/15)    Time 6   Period Weeks   Status New   PT LONG TERM GOAL #5   Title improve FOTO =/< 31% limited ( 11/27/15)    Time 6   Period Weeks   Status New               Plan - 10/16/15 1215    Clinical Impression Statement 60 yo female presents with 3 wk h/o Lt sided low back pain.  She is very hypomobile in her lumbar spine with tight muscles in the lumbar region and Lt buttocks.  She also has some higher level core weakness.  She has not been working with her trainer since her back flared up.    Pt will benefit from skilled therapeutic intervention in order to improve on the following deficits Decreased strength;Hypomobility;Pain;Decreased endurance;Increased muscle spasms   Rehab Potential Good   PT Frequency 2x / week   PT Duration 6 weeks   PT Treatment/Interventions Ultrasound;Traction;Neuromuscular re-education;Patient/family education;Cryotherapy;Dry needling;Electrical Stimulation;Moist Heat;Therapeutic exercise;Manual techniques;Taping   PT Next Visit Plan TDN, manual work to her back and begin pelvic press and TA work   Financial planner with Plan of Care Patient         Problem List Patient Active Problem List   Diagnosis Date Noted  . Acute bronchitis 10/09/2015  . Irritable bowel syndrome with constipation 04/11/2015  . 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 Seborrheic dermatitis 03/14/2015  . Hyperlipidemia 05/29/2014  . Exertional chest pain 05/28/2014  . Overweight (BMI 25.0-29.9) 08/15/2013  . Post-menopausal 08/15/2013  . Preventive measure 08/15/2013  . Generalized osteoarthritis of hand 08/15/2013  .  Osteoarthritis of both knees 08/15/2013  . Low back pain 08/15/2013  . Insomnia 08/15/2013  . Anxiety and depression 08/15/2013  . Barrett's esophagus with esophagitis 08/15/2013    Roderic Scarce PT 10/16/2015, 2:25 PM  [facility] [address] [place], [place], [address] Phone: [phone]   Fax:  [phone]  Name: Katherine Lawson MRN: [id number] Date of Birth: Oct 11, 1955

## 2015-10-16 NOTE — Patient Instructions (Addendum)
Cat / Cow Flow    Inhale, press spine toward ceiling like a Halloween cat. Keeping strength in arms and abdominals, exhale to soften spine through neutral and into cow pose. Open chest and arch back. Initiate movement between cat and cow at tailbone, one vertebrae at a time. Repeat __10__ times. Once a day  BACK: Child's Pose (Sciatica)    Sit in knee-chest position and reach arms forward. Separate knees for comfort. Hold position for _8__ breaths. Repeat _1__ times. Do _1__ times per day.  Lumbar Rotation Stretch    Lie on back with left knee drawn toward chest. Slowly bring bent leg across body until stretch is felt in lower back/hip area. Hold __30__ seconds. Repeat __1__ times per set. Do ___1_ sets per session. Do __1__ sessions per day. Repeat on the other side.    Outer Hip Stretch: Reclined IT Band Stretch (Strap)    Strap around opposite foot, pull across only as far as possible with shoulders on mat. Hold for __8__ breaths. Repeat _1___ times each leg.  SLUMP: Mobilization VII    Slouched, chin tucked, ( only if no sensation if felt with lifting toes, other hand on back of head applying gentle pressure). Straighten left knee, pull toes up. Do _1__ sets of _10__ repetitions per session, hold 3-5 sec. Do _1__ sessions per day.  Copyright  VHI. All rights reserved.     TENS UNIT: This is helpful for muscle pain and spasm.   Search and Purchase a TENS 7000 2nd edition at www.tenspros.com. It should be less than $30.     TENS unit instructions: Do not shower or bathe with the unit on Turn the unit off before removing electrodes or batteries If the electrodes lose stickiness add a drop of water to the electrodes after they are disconnected from the unit and place on plastic sheet. If you continued to have difficulty, call the TENS unit company to purchase more electrodes. Do not apply lotion on the skin area prior to use. Make sure the skin is clean and dry as  this will help prolong the life of the electrodes. After use, always check skin for unusual red areas, rash or other skin difficulties. If there are any skin problems, does not apply electrodes to the same area. Never remove the electrodes from the unit by pulling the wires. Do not use the TENS unit or electrodes other than as directed. Do not change electrode placement without consultating your therapist or physician. Keep 2 fingers with between each electrode. Wear time ratio is 2:1, on to off times.    For example on for 30 minutes off for 15 minutes and then on for 30 minutes off for 15 minutes

## 2015-10-20 ENCOUNTER — Ambulatory Visit (INDEPENDENT_AMBULATORY_CARE_PROVIDER_SITE_OTHER): Payer: BLUE CROSS/BLUE SHIELD | Admitting: Physical Therapy

## 2015-10-20 DIAGNOSIS — M545 Low back pain, unspecified: Secondary | ICD-10-CM

## 2015-10-20 DIAGNOSIS — R531 Weakness: Secondary | ICD-10-CM | POA: Diagnosis not present

## 2015-10-20 NOTE — Therapy (Signed)
[facility] [place] [address] [place], [place], [address] Phone: [phone]   Fax:  [phone]  Physical Therapy Treatment  Patient Details  Name: Katherine Lawson MRN: [id number] Date of Birth: [birth year] Referring Provider: Dr Benjamin Stain  Encounter Date: 10/20/2015      PT End of Session - 10/20/15 1407    Visit Number 2   Number of Visits 12   Date for PT Re-Evaluation 11/27/15   PT Start Time 1403   PT Stop Time 1449   PT Time Calculation (min) 46 min      Past Medical History  Diagnosis Date  . Hx of breast implants, bilateral 1990  . S/P partial hysterectomy 1980  . Barrett's esophagus   . Arthritis   . Anxiety   . Hiatal hernia     Past Surgical History  Procedure Laterality Date  . Abdominal hysterectomy    . Breast enhancement surgery  1989  . Nose surgery    . Wrist surgery    . Cholecystectomy    . Sculpting      Thermal sculpting of abdominal adipose tissue    There were no vitals filed for this visit.  Visit Diagnosis:  Left-sided low back pain without sciatica  Weakness generalized      Subjective Assessment - 10/20/15 1407    Subjective Pt reports her pain has remained constant.  estim gave some temporary relief.     Currently in Pain? Yes   Pain Score 8    Pain Location Back   Pain Orientation Left   Pain Descriptors / Indicators Constant;Sharp   Aggravating Factors  prolonged positions   Pain Relieving Factors ???            [facility] PT Assessment - 10/20/15 0001    Assessment   Medical Diagnosis Lt side LBP   Onset Date/Surgical Date 09/25/15   Hand Dominance Right   Next MD Visit PRN   Prior Therapy none            [facility] Adult PT Treatment/Exercise - 10/20/15 0001    Lumbar Exercises: Stretches   Passive Hamstring Stretch 30 seconds;4 reps  supine and seated   Standing Extension 3 reps  5 sec   ITB Stretch 2 reps;30 seconds   Piriformis Stretch 2 reps;30 seconds    Lumbar Exercises: Seated   Sit to Stand 5 reps  with core engaged   Lumbar Exercises: Supine   Ab Set 5 reps;5 seconds   AB Set Limitations tactile and VC to initiate.    Clam 10 reps  each leg with ab set   Heel Slides 10 reps  with ab set   Bent Knee Raise 10 reps  each leg with ab set .   Modalities   Modalities Electrical Stimulation;Moist Heat   Moist Heat Therapy   Number Minutes Moist Heat 15 Minutes   Moist Heat Location Lumbar Spine  and buttocks   Electrical Stimulation   Electrical Stimulation Location lumbar and buttocks   Electrical Stimulation Action IFC   Electrical Stimulation Parameters to tolerance    Electrical Stimulation Goals Pain;Tone                     PT Long Term Goals - 10/16/15 1219    PT LONG TERM GOAL #1   Title I with advanced HEP ( 11/27/15)    Time 6   Period Weeks   Status New   PT LONG TERM GOAL #  2   Title demo strong and equal contraction of lumbar multifidi ( 11/27/15)    Time 6   Period Weeks   Status New   PT LONG TERM GOAL #3   Title report =/> 75% reduction in Lt side LBP ( 11/27/15)    Time 6   Period Weeks   Status New   PT LONG TERM GOAL #4   Title improve hip strength =/> 5-/5 ( 11/27/15)    Time 6   Period Weeks   Status New   PT LONG TERM GOAL #5   Title improve FOTO =/< 31% limited ( 11/27/15)    Time 6   Period Weeks   Status New               Plan - 10/20/15 1717    Clinical Impression Statement Pt reported decrease in LBP with engagement of core muscles during transitional movements.  Pt also reported decrease in pain level to 1/10 at end of session after use of estim / MHP.  Progressing toward goals   Pt will benefit from skilled therapeutic intervention in order to improve on the following deficits Decreased strength;Hypomobility;Pain;Decreased endurance;Increased muscle spasms   Rehab Potential Good   PT Frequency 2x / week   PT Duration 6 weeks   PT Treatment/Interventions  Ultrasound;Traction;Neuromuscular re-education;Patient/family education;Cryotherapy;Dry needling;Electrical Stimulation;Moist Heat;Therapeutic exercise;Manual techniques;Taping   PT Next Visit Plan Review TA series and body mechanics.  Introduce pelvic press series.    Consulted and Agree with Plan of Care Patient        Problem List Patient Active Problem List   Diagnosis Date Noted  . Acute bronchitis 10/09/2015  . Irritable bowel syndrome with constipation 04/11/2015  . 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 Seborrheic dermatitis 03/14/2015  . Hyperlipidemia 05/29/2014  . Exertional chest pain 05/28/2014  . Overweight (BMI 25.0-29.9) 08/15/2013  . Post-menopausal 08/15/2013  . Preventive measure 08/15/2013  . Generalized osteoarthritis of hand 08/15/2013  . Osteoarthritis of both knees 08/15/2013  . Low back pain 08/15/2013  . Insomnia 08/15/2013  . Anxiety and depression 08/15/2013  . Barrett's esophagus with esophagitis 08/15/2013   Mayer CamelJennifer Carlson-Long, PTA 10/20/2015 5:19 PM  [facility] [address] [place], [place], [address] Phone: [phone]   Fax:  [phone]  Name: Katherine Lawson MRN: [id number] Date of Birth: [birth year]

## 2015-10-20 NOTE — Patient Instructions (Signed)
  Abdominal Bracing With Pelvic Floor (Hook-Lying)   With neutral spine, tighten pelvic floor and abdominals. Hold 10 seconds. Repeat __10_ times. Do _1__ times a day.   Knee to Chest: Transverse Plane Stability   Bring one knee up, then return. Be sure pelvis does not roll side to side. Keep pelvis still. Lift knee __10_ times each leg. Restabilize pelvis. Repeat with other leg. Do _1-2__ sets, _1__ times per day.   Hip External Rotation With Pillow: Transverse Plane Stability   One knee bent, one leg straight, on pillow. Slowly roll bent knee out. Be sure pelvis does not rotate. Do _10__ times. Restabilize pelvis. Repeat with other leg. Do _1-2__ sets, _1__ times per day.  Heel Slide: 4-10 Inches - Transverse Plane Stability   Slide heel 4 inches down. Be sure pelvis does not rotate. Do _10__ times. Restabilize pelvis. Repeat with other leg. Do __1_ sets, _1__ times per day.   [facility] [address] [place], [place] [address]  [phone] (office) [phone] (fax)   

## 2015-10-22 ENCOUNTER — Other Ambulatory Visit: Payer: BLUE CROSS/BLUE SHIELD

## 2015-10-23 ENCOUNTER — Encounter: Payer: Self-pay | Admitting: Physical Therapy

## 2015-10-23 ENCOUNTER — Ambulatory Visit (INDEPENDENT_AMBULATORY_CARE_PROVIDER_SITE_OTHER): Payer: BLUE CROSS/BLUE SHIELD | Admitting: Physical Therapy

## 2015-10-23 DIAGNOSIS — M545 Low back pain, unspecified: Secondary | ICD-10-CM

## 2015-10-23 DIAGNOSIS — R531 Weakness: Secondary | ICD-10-CM

## 2015-10-23 NOTE — Therapy (Signed)
[place], [place], [address] Phone: [phone]   Fax:  [phone]  Physical Therapy Treatment  Patient Details  Name: Katherine Lawson MRN: [id number] Date of Birth: 06-11-1956 Referring Provider: Dr Dianah Field  Encounter Date: 10/23/2015      PT End of Session - 10/23/15 1102    Visit Number 3   Number of Visits 12   Date for PT Re-Evaluation 11/27/15   PT Start Time 1102   PT Stop Time 1201   PT Time Calculation (min) 59 min   Activity Tolerance Patient tolerated treatment well      Past Medical History  Diagnosis Date  . Hx of breast implants, bilateral 1990  . S/P partial hysterectomy 1980  . Barrett's esophagus   . Arthritis   . Anxiety   . Hiatal hernia     Past Surgical History  Procedure Laterality Date  . Abdominal hysterectomy    . Breast enhancement surgery  1989  . Nose surgery    . Wrist surgery    . Cholecystectomy    . Sculpting      Thermal sculpting of abdominal adipose tissue    There were no vitals filed for this visit.  Visit Diagnosis:  Left-sided low back pain without sciatica  Weakness generalized      Subjective Assessment - 10/23/15 1104    Subjective Pt states she is doing her HEP and working hard on TA bracing.  She reports her vision is blurry for the last couple of days.  Does have allergies, not sure what it is.    Patient Stated Goals decrease pain or have it go away, strengthen abdominals and back to allow her to continue to walk Not need surgery on her back.    Currently in Pain? Yes   Pain Score 8    Pain Location Back   Pain Orientation Left   Pain Descriptors / Indicators Sharp   Pain Onset More than a month ago   Pain Frequency Constant   Aggravating Factors  prolonged positions   Pain Relieving Factors not sure of anything                         [facility] Adult PT Treatment/Exercise - 10/23/15 0001    Self-Care   Self-Care Other Self-Care Comments   Other Self-Care Comments  Home TENS unit   Lumbar Exercises: Stretches   Double Knee to Chest Stretch 2 reps;30 seconds  2nd with TPR using ball in Lt QL area.   Lower Trunk Rotation 1 rep;30 seconds  knees to Rt    Lumbar Exercises: Aerobic   Stationary Bike Nustep L5x5'   Lumbar Exercises: Supine   Ab Set 10 reps;5 seconds  with buzzing   Clam 10 reps   Bent Knee Raise 10 reps  with TA bracing, VC for form   Modalities   Modalities Electrical Stimulation;Moist Heat   Moist Heat Therapy   Number Minutes Moist Heat 15 Minutes   Moist Heat Location Lumbar Spine   Electrical Stimulation   Electrical Stimulation Location lumbar and buttocks   Electrical Stimulation Action used home tens, went through all modes   Electrical Stimulation Parameters to tolerance   Electrical Stimulation Goals Pain;Tone   Manual Therapy   Manual Therapy Soft tissue mobilization   Soft tissue mobilization Lt QL and lumbar paraspinals, decreased pain and tightness following treatment.  Trigger Point Dry Needling - 10/23/15 1113    Consent Given? Yes   Education Handout Provided Yes   Muscles Treated Upper Body Quadratus Lumborum;Longissimus  Left   Longissimus Response Twitch response elicited;Palpable increased muscle length  pt extremely tight in these muscles, did have some releases.              PT Education - 10/23/15 1113    Education provided Yes   Education Details TDN & TENS   Person(s) Educated Patient   Methods Explanation;Demonstration;Handout   Comprehension Returned demonstration;Verbalized understanding             PT Long Term Goals - 10/23/15 1106    PT LONG TERM GOAL #1   Title I with advanced HEP ( 11/27/15)    Time 6   Period Weeks   Status On-going   PT LONG TERM GOAL #2   Title demo strong and equal contraction of lumbar multifidi ( 11/27/15)    Time 6   Period Weeks   Status On-going   PT LONG TERM GOAL #3    Title report =/> 75% reduction in Lt side LBP ( 11/27/15)    Time 6   Period Weeks   Status On-going   PT LONG TERM GOAL #4   Title improve hip strength =/> 5-/5 ( 11/27/15)    Time 6   Period Weeks   Status On-going   PT LONG TERM GOAL #5   Title improve FOTO =/< 31% limited ( 11/27/15)    Time 6   Period Weeks   Status On-going               Plan - 10/23/15 1155    Clinical Impression Statement Katherine Lawson had some good releases with TDN however she is still very tight and will most likely need more TDN. She was able to perform her abdominal exercises better with improved form.  No goals met only the start of her second week with PT   Pt will benefit from skilled therapeutic intervention in order to improve on the following deficits Decreased strength;Hypomobility;Pain;Decreased endurance;Increased muscle spasms   Rehab Potential Good   PT Frequency 2x / week   PT Duration 6 weeks   PT Treatment/Interventions Ultrasound;Traction;Neuromuscular re-education;Patient/family education;Cryotherapy;Dry needling;Electrical Stimulation;Moist Heat;Therapeutic exercise;Manual techniques;Taping   PT Next Visit Plan assess response to TDN, perform again and add pelvic press    Consulted and Agree with Plan of Care Patient        Problem List Patient Active Problem List   Diagnosis Date Noted  . Acute bronchitis 10/09/2015  . Irritable bowel syndrome with constipation 04/11/2015  . 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 Seborrheic dermatitis 03/14/2015  . Hyperlipidemia 05/29/2014  . Exertional chest pain 05/28/2014  . Overweight (BMI 25.0-29.9) 08/15/2013  . Post-menopausal 08/15/2013  . Preventive measure 08/15/2013  . Generalized osteoarthritis of hand 08/15/2013  . Osteoarthritis of both knees 08/15/2013  . Low back pain 08/15/2013  . Insomnia 08/15/2013  . Anxiety and depression 08/15/2013  . Barrett's esophagus with esophagitis 08/15/2013    Jeral Pinch PT 10/23/2015, 11:57  AM  [facility] [place], [place], [address] Phone: [phone]   Fax:  [phone]  Name: Katherine Lawson MRN: [id number] Date of Birth: [birth year]

## 2015-10-23 NOTE — Patient Instructions (Signed)

## 2015-10-27 ENCOUNTER — Encounter: Payer: BLUE CROSS/BLUE SHIELD | Admitting: Physical Therapy

## 2015-10-30 ENCOUNTER — Other Ambulatory Visit: Payer: Self-pay | Admitting: Sports Medicine

## 2015-10-30 ENCOUNTER — Encounter: Payer: BLUE CROSS/BLUE SHIELD | Admitting: Rehabilitative and Restorative Service Providers"

## 2015-11-03 ENCOUNTER — Encounter: Payer: Self-pay | Admitting: Rehabilitative and Restorative Service Providers"

## 2015-11-03 ENCOUNTER — Ambulatory Visit (INDEPENDENT_AMBULATORY_CARE_PROVIDER_SITE_OTHER): Payer: BLUE CROSS/BLUE SHIELD | Admitting: Rehabilitative and Restorative Service Providers"

## 2015-11-03 DIAGNOSIS — M545 Low back pain, unspecified: Secondary | ICD-10-CM

## 2015-11-03 DIAGNOSIS — R531 Weakness: Secondary | ICD-10-CM | POA: Diagnosis not present

## 2015-11-03 NOTE — Patient Instructions (Addendum)
Abdominal Bracing With Pelvic Floor (Hook-Lying)    With neutral spine, tighten pelvic floor and abdominals sucking belly button to back bone; tighten muscles in low back at waist. Hold 10 sec Repeat _10 __ times. Do _several__ times a day. Progress to do this in sitting; standing; walking and with functional activities    Pelvic Press    Place hands under belly between navel and pubic bone, palms up. Feel pressure on hands. Increase pressure on hands by pressing pelvis down. This is NOT a pelvic tilt. Hold __3-5_ seconds. Relax. Repeat _10_ times.   Gluteal Sets    Tighten buttocks while pressing pelvis to floor. Hold _10___ seconds. Repeat ____ times per set. Do ____ sets per session. Do ____ sessions per day.   Piriformis Stretch    Lying on back, pull right knee toward opposite shoulder. Hold __30__ seconds. Repeat _3___ times. Do __2-3__ sessions per day.

## 2015-11-03 NOTE — Therapy (Signed)
[facility] [place] [address] [place], [place], [address] Phone: [phone]   Fax:  [phone]  Physical Therapy Treatment  Patient Details  Name: Katherine Lawson MRN: [id number] Date of Birth: 1956/04/23 Referring Provider: Dr Benjamin Stain  Encounter Date: 11/03/2015      PT End of Session - 11/03/15 1103    Visit Number 4   Number of Visits 12   Date for PT Re-Evaluation 11/27/15   PT Start Time 1103   PT Stop Time 1209   PT Time Calculation (min) 66 min   Activity Tolerance Patient tolerated treatment well      Past Medical History  Diagnosis Date  . Hx of breast implants, bilateral 1990  . S/P partial hysterectomy 1980  . Barrett's esophagus   . Arthritis   . Anxiety   . Hiatal hernia     Past Surgical History  Procedure Laterality Date  . Abdominal hysterectomy    . Breast enhancement surgery  1989  . Nose surgery    . Wrist surgery    . Cholecystectomy    . Sculpting      Thermal sculpting of abdominal adipose tissue    There were no vitals filed for this visit.      Subjective Assessment - 11/03/15 1103    Subjective No significant change in symptoms. She has conintued with HEP but "slacks off" on the weekend. Good response to TDN and would like to try TDN again.    Currently in Pain? Yes   Pain Score 8    Pain Location Back   Pain Orientation Left   Pain Descriptors / Indicators Sharp   Pain Type Acute pain   Pain Onset More than a month ago   Pain Frequency Constant                         [facility] Adult PT Treatment/Exercise - 11/03/15 0001    Lumbar Exercises: Stretches   Double Knee to Chest Stretch 2 reps;30 seconds  2nd with TPR using ball in Lt QL area.   Lower Trunk Rotation 1 rep;30 seconds  knees to Rt    Piriformis Stretch 3 reps;30 seconds  travell   Lumbar Exercises: Aerobic   Stationary Bike Nustep L5x5'   Lumbar Exercises: Supine   Ab Set --  3 part core  10 sec x 10   Lumbar Exercises: Prone   Other Prone Lumbar Exercises prone pelvic press x10  trunk ext with pelvic press increased pain did not add    Other Prone Lumbar Exercises glut set 10 sec x 10   Modalities   Modalities Electrical Stimulation;Moist Heat   Moist Heat Therapy   Number Minutes Moist Heat 15 Minutes   Moist Heat Location Lumbar Spine   Electrical Stimulation   Electrical Stimulation Location lumbar and buttocks   Electrical Stimulation Action IFC   Electrical Stimulation Parameters to tolerance   Electrical Stimulation Goals Pain;Tone   Manual Therapy   Manual Therapy Soft tissue mobilization   Soft tissue mobilization Lt QL and lumbar paraspinals, decreased pain and tightness following treatment.           Trigger Point Dry Needling - 11/03/15 1110    Consent Given? Yes   Education Handout Provided No   Muscles Treated Upper Body Gluteus maximus   Longissimus Response Twitch response elicited              PT Education - 11/03/15  1131    Education provided Yes   Education Details HEP    Person(s) Educated Patient   Methods Explanation;Demonstration;Tactile cues;Verbal cues;Handout   Comprehension Verbalized understanding;Returned demonstration;Verbal cues required;Tactile cues required             PT Long Term Goals - 11/03/15 1300    PT LONG TERM GOAL #1   Title I with advanced HEP ( 11/27/15)    Time 6   Period Weeks   Status On-going   PT LONG TERM GOAL #2   Title demo strong and equal contraction of lumbar multifidi ( 11/27/15)    Time 6   Period Weeks   Status On-going   PT LONG TERM GOAL #3   Title report =/> 75% reduction in Lt side LBP ( 11/27/15)    Time 6   Period Weeks   Status On-going   PT LONG TERM GOAL #4   Title improve hip strength =/> 5-/5 ( 11/27/15)    Time 6   Period Weeks   Status On-going   PT LONG TERM GOAL #5   Title improve FOTO =/< 31% limited ( 11/27/15)    Time 6   Period Weeks   Status On-going                Plan - 11/03/15 1302    Clinical Impression Statement Good response to TDN with twich and good release with work through the [employer] multifidi/longumus area and lesser response through glut med/max. Good stretch with modified piriformis stretch. Tolerated pelvic press but had increased discomfort and lumbar tightness with progression of pelvic press series which was held.  Improved movement and decresaed pain with treatment. Progressing well toward stated goals of therapy.    Rehab Potential Good   PT Frequency 2x / week   PT Duration 6 weeks   PT Treatment/Interventions Ultrasound;Traction;Neuromuscular re-education;Patient/family education;Cryotherapy;Dry needling;Electrical Stimulation;Moist Heat;Therapeutic exercise;Manual techniques;Taping   PT Next Visit Plan TDN as indicated; progress exercise as tolerated possibly try cat cow progressing to sit back?; manual work as indicated    Financial plannerConsulted and Agree with Plan of Care Patient      Patient will benefit from skilled therapeutic intervention in order to improve the following deficits and impairments:  Decreased strength, Hypomobility, Pain, Decreased endurance, Increased muscle spasms  Visit Diagnosis: Left-sided low back pain without sciatica - Plan: PT plan of care cert/re-cert  Weakness generalized - Plan: PT plan of care cert/re-cert     Problem List Patient Active Problem List   Diagnosis Date Noted  . Acute bronchitis 10/09/2015  . Irritable bowel syndrome with constipation 04/11/2015  . 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 Seborrheic dermatitis 03/14/2015  . Hyperlipidemia 05/29/2014  . Exertional chest pain 05/28/2014  . Overweight (BMI 25.0-29.9) 08/15/2013  . Post-menopausal 08/15/2013  . Preventive measure 08/15/2013  . Generalized osteoarthritis of hand 08/15/2013  . Osteoarthritis of both knees 08/15/2013  . Low back pain 08/15/2013  . Insomnia 08/15/2013  . Anxiety and depression 08/15/2013  .  Barrett's esophagus with esophagitis 08/15/2013    Zita Ozimek Rober MinionP Dorman Calderwood PT, MPH  11/03/2015, 1:08 PM  [facility] [address] [place], [place], [address] Phone: [phone]   Fax:  [phone]  Name: Cephus ShellingRiene Saad MRN: [id number] Date of Birth: [birth year]

## 2015-11-05 ENCOUNTER — Ambulatory Visit (INDEPENDENT_AMBULATORY_CARE_PROVIDER_SITE_OTHER): Payer: BLUE CROSS/BLUE SHIELD | Admitting: Physical Therapy

## 2015-11-05 ENCOUNTER — Encounter: Payer: Self-pay | Admitting: Physical Therapy

## 2015-11-05 DIAGNOSIS — M545 Low back pain, unspecified: Secondary | ICD-10-CM

## 2015-11-05 DIAGNOSIS — R531 Weakness: Secondary | ICD-10-CM | POA: Diagnosis not present

## 2015-11-05 NOTE — Therapy (Addendum)
[address] [place], [place], [address] Phone: [phone]   Fax:  [phone]  Physical Therapy Treatment  Patient Details  Name: Britney Newstrom MRN: [id number] Date of Birth: [birth year] Referring Provider: Dr Dianah Field  Encounter Date: 11/05/2015      PT End of Session - 11/05/15 1057    Visit Number 5   Number of Visits 12   Date for PT Re-Evaluation 11/27/15   PT Start Time 1048   PT Stop Time 1150   PT Time Calculation (min) 62 min   Activity Tolerance Patient tolerated treatment well      Past Medical History  Diagnosis Date  . Hx of breast implants, bilateral 1990  . S/P partial hysterectomy 1980  . Barrett's esophagus   . Arthritis   . Anxiety   . Hiatal hernia     Past Surgical History  Procedure Laterality Date  . Abdominal hysterectomy    . Breast enhancement surgery  1989  . Nose surgery    . Wrist surgery    . Cholecystectomy    . Sculpting      Thermal sculpting of abdominal adipose tissue    There were no vitals filed for this visit.      Subjective Assessment - 11/05/15 1057    Subjective She was feeling really good until on the nustep. Then sore.    Currently in Pain? Yes   Pain Score 7    Pain Location Back   Pain Orientation Left   Pain Descriptors / Indicators Sharp   Pain Type Acute pain   Pain Onset More than a month ago   Pain Frequency Constant   Aggravating Factors  wearing heels at work, long walks outside,sitting on the sofa   Pain Relieving Factors needling helps                         [facility] Adult PT Treatment/Exercise - 11/05/15 0001    Exercises   Exercises Lumbar   Lumbar Exercises: Stretches   Passive Hamstring Stretch 2 reps;30 seconds   Piriformis Stretch 3 reps;30 seconds   Lumbar Exercises: Aerobic   Stationary Bike Nustep L5x8'   Lumbar Exercises: Standing   Other Standing Lumbar Exercises FWD bend stretch at sink for  low back and QLs   Lumbar Exercises: Supine   Straight Leg Raise 15 reps  with abd bracing   Lumbar Exercises: Quadruped   Madcat/Old Horse 10 reps   Modalities   Modalities Electrical Stimulation;Moist Heat   Manual Therapy   Soft tissue mobilization Lt QL, buttocks and lumbar paraspinals, decreased pain and tightness following treatment.           Trigger Point Dry Needling - 11/05/15 1129    Consent Given? Yes   Education Handout Provided No   Muscles Treated Upper Body Longissimus  Rt   Longissimus Response Twitch response elicited;Palpable increased muscle length  with HV stim L 4- T10                   PT Long Term Goals - 11/03/15 1300    PT LONG TERM GOAL #1   Title I with advanced HEP ( 11/27/15)    Time 6   Period Weeks   Status On-going   PT LONG TERM GOAL #2   Title demo strong and equal contraction of lumbar multifidi ( 11/27/15)    Time 6   Period Weeks  Status On-going   PT LONG TERM GOAL #3   Title report =/> 75% reduction in Lt side LBP ( 11/27/15)    Time 6   Period Weeks   Status On-going   PT LONG TERM GOAL #4   Title improve hip strength =/> 5-/5 ( 11/27/15)    Time 6   Period Weeks   Status On-going   PT LONG TERM GOAL #5   Title improve FOTO =/< 31% limited ( 11/27/15)    Time 6   Period Weeks   Status On-going               Plan - 11/05/15 1130    Clinical Impression Statement Pt states her pain is slowly decreasing. No new goals met,  Once the muscles in her low back release more she will need core stablization    Rehab Potential Good   PT Frequency 2x / week   PT Duration 6 weeks   PT Treatment/Interventions Ultrasound;Traction;Neuromuscular re-education;Patient/family education;Cryotherapy;Dry needling;Electrical Stimulation;Moist Heat;Therapeutic exercise;Manual techniques;Taping   PT Next Visit Plan TDN as indicated; progress exercise as tolerated ; manual work as indicated    Oncologist with Plan of Care  Patient      Patient will benefit from skilled therapeutic intervention in order to improve the following deficits and impairments:  Decreased strength, Hypomobility, Pain, Decreased endurance, Increased muscle spasms  Visit Diagnosis: Left-sided low back pain without sciatica  Weakness generalized     Problem List Patient Active Problem List   Diagnosis Date Noted  . Acute bronchitis 10/09/2015  . Irritable bowel syndrome with constipation 04/11/2015  . 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 Seborrheic dermatitis 03/14/2015  . Hyperlipidemia 05/29/2014  . Exertional chest pain 05/28/2014  . Overweight (BMI 25.0-29.9) 08/15/2013  . Post-menopausal 08/15/2013  . Preventive measure 08/15/2013  . Generalized osteoarthritis of hand 08/15/2013  . Osteoarthritis of both knees 08/15/2013  . Low back pain 08/15/2013  . Insomnia 08/15/2013  . Anxiety and depression 08/15/2013  . Barrett's esophagus with esophagitis 08/15/2013    Jeral Pinch PT 11/05/2015, 11:35 AM  [facility] [address] [place], [place], [address] Phone: [phone]   Fax:  [phone]  Name: Terianna Peggs MRN: [id number] Date of Birth: 28-Aug-1955    PHYSICAL THERAPY DISCHARGE SUMMARY  Visits from Start of Care: 5  Current functional level related to goals / functional outcomes: unknown   Remaining deficits: unknown   Education / Equipment: HEP Plan:                                                    Patient goals were not met. Patient is being discharged due to not returning since the last visit.  ?????    Jeral Pinch, PT 01/15/2016 10:26 AM

## 2015-11-06 ENCOUNTER — Ambulatory Visit: Payer: BLUE CROSS/BLUE SHIELD | Admitting: Sports Medicine

## 2015-11-22 DIAGNOSIS — H539 Unspecified visual disturbance: Secondary | ICD-10-CM | POA: Diagnosis not present

## 2015-11-24 ENCOUNTER — Encounter: Payer: BLUE CROSS/BLUE SHIELD | Admitting: Physical Therapy

## 2015-12-02 ENCOUNTER — Other Ambulatory Visit: Payer: Self-pay | Admitting: Sports Medicine

## 2015-12-10 DIAGNOSIS — R102 Pelvic and perineal pain: Secondary | ICD-10-CM | POA: Diagnosis not present

## 2015-12-10 DIAGNOSIS — F529 Unspecified sexual dysfunction not due to a substance or known physiological condition: Secondary | ICD-10-CM | POA: Diagnosis not present

## 2015-12-10 DIAGNOSIS — Z6823 Body mass index (BMI) 23.0-23.9, adult: Secondary | ICD-10-CM | POA: Diagnosis not present

## 2015-12-11 DIAGNOSIS — D123 Benign neoplasm of transverse colon: Secondary | ICD-10-CM | POA: Diagnosis not present

## 2015-12-11 DIAGNOSIS — D124 Benign neoplasm of descending colon: Secondary | ICD-10-CM | POA: Diagnosis not present

## 2015-12-11 DIAGNOSIS — D122 Benign neoplasm of ascending colon: Secondary | ICD-10-CM | POA: Diagnosis not present

## 2015-12-11 DIAGNOSIS — D125 Benign neoplasm of sigmoid colon: Secondary | ICD-10-CM | POA: Diagnosis not present

## 2015-12-11 DIAGNOSIS — Z86010 Personal history of colonic polyps: Secondary | ICD-10-CM | POA: Diagnosis not present

## 2015-12-12 ENCOUNTER — Telehealth: Payer: Self-pay

## 2015-12-12 DIAGNOSIS — H539 Unspecified visual disturbance: Secondary | ICD-10-CM

## 2015-12-12 NOTE — Telephone Encounter (Signed)
Pt left VM and would like a referral to Opthalmology [facility]. She says this is urgent and would like this as soon as possible. Pleas assist.

## 2015-12-12 NOTE — Telephone Encounter (Signed)
For what diagnosis?  Whats the reason?

## 2015-12-16 NOTE — Telephone Encounter (Signed)
Unable to reach patient by phone and left VM

## 2015-12-18 DIAGNOSIS — N83201 Unspecified ovarian cyst, right side: Secondary | ICD-10-CM | POA: Diagnosis not present

## 2015-12-18 DIAGNOSIS — R102 Pelvic and perineal pain: Secondary | ICD-10-CM | POA: Diagnosis not present

## 2015-12-23 DIAGNOSIS — N951 Menopausal and female climacteric states: Secondary | ICD-10-CM | POA: Diagnosis not present

## 2015-12-23 NOTE — Addendum Note (Signed)
Addended by: Monica BectonHEKKEKANDAM, Collie Kittel J on: 12/23/2015 08:49 AM   Modules accepted: Orders

## 2015-12-23 NOTE — Telephone Encounter (Signed)
Referral placed, i just used "visual changes"

## 2015-12-23 NOTE — Telephone Encounter (Signed)
Pt left message on Triage VM regarding referral. Pt states she needs a referral to Dr. Carilyn GoodpastureYeatts [facility]) ASAP. Pt reports their office phone number is [phone]. Pt did not leave a reason for why referral is needed.  Attempted to contact Pt, no answer. Left VM to return clinic call regarding diagnosis or reason for referral. Callback information provided.

## 2015-12-26 DIAGNOSIS — E559 Vitamin D deficiency, unspecified: Secondary | ICD-10-CM | POA: Diagnosis not present

## 2015-12-26 DIAGNOSIS — E538 Deficiency of other specified B group vitamins: Secondary | ICD-10-CM | POA: Diagnosis not present

## 2015-12-26 DIAGNOSIS — N951 Menopausal and female climacteric states: Secondary | ICD-10-CM | POA: Diagnosis not present

## 2016-01-05 ENCOUNTER — Other Ambulatory Visit: Payer: Self-pay | Admitting: Sports Medicine

## 2016-01-19 DIAGNOSIS — N393 Stress incontinence (female) (male): Secondary | ICD-10-CM | POA: Diagnosis not present

## 2016-01-19 DIAGNOSIS — M6240 Contracture of muscle, unspecified site: Secondary | ICD-10-CM | POA: Diagnosis not present

## 2016-01-19 DIAGNOSIS — R102 Pelvic and perineal pain: Secondary | ICD-10-CM | POA: Diagnosis not present

## 2016-02-02 DIAGNOSIS — M6240 Contracture of muscle, unspecified site: Secondary | ICD-10-CM | POA: Diagnosis not present

## 2016-02-02 DIAGNOSIS — N393 Stress incontinence (female) (male): Secondary | ICD-10-CM | POA: Diagnosis not present

## 2016-02-05 DIAGNOSIS — N83202 Unspecified ovarian cyst, left side: Secondary | ICD-10-CM | POA: Diagnosis not present

## 2016-02-05 DIAGNOSIS — N83201 Unspecified ovarian cyst, right side: Secondary | ICD-10-CM | POA: Diagnosis not present

## 2016-02-06 ENCOUNTER — Ambulatory Visit (INDEPENDENT_AMBULATORY_CARE_PROVIDER_SITE_OTHER): Payer: BLUE CROSS/BLUE SHIELD

## 2016-02-06 ENCOUNTER — Ambulatory Visit (INDEPENDENT_AMBULATORY_CARE_PROVIDER_SITE_OTHER): Payer: BLUE CROSS/BLUE SHIELD | Admitting: Sports Medicine

## 2016-02-06 ENCOUNTER — Encounter: Payer: Self-pay | Admitting: Sports Medicine

## 2016-02-06 VITALS — BP 124/72 | HR 62 | Resp 18 | Wt 139.0 lb

## 2016-02-06 DIAGNOSIS — M545 Low back pain: Secondary | ICD-10-CM | POA: Diagnosis not present

## 2016-02-06 DIAGNOSIS — M4316 Spondylolisthesis, lumbar region: Secondary | ICD-10-CM

## 2016-02-06 DIAGNOSIS — W19XXXA Unspecified fall, initial encounter: Secondary | ICD-10-CM

## 2016-02-06 DIAGNOSIS — S20221A Contusion of right back wall of thorax, initial encounter: Secondary | ICD-10-CM | POA: Diagnosis not present

## 2016-02-06 DIAGNOSIS — M4184 Other forms of scoliosis, thoracic region: Secondary | ICD-10-CM

## 2016-02-06 DIAGNOSIS — E282 Polycystic ovarian syndrome: Secondary | ICD-10-CM

## 2016-02-06 DIAGNOSIS — M542 Cervicalgia: Secondary | ICD-10-CM

## 2016-02-06 DIAGNOSIS — S299XXA Unspecified injury of thorax, initial encounter: Secondary | ICD-10-CM | POA: Diagnosis not present

## 2016-02-06 DIAGNOSIS — S199XXA Unspecified injury of neck, initial encounter: Secondary | ICD-10-CM | POA: Diagnosis not present

## 2016-02-06 DIAGNOSIS — S3992XA Unspecified injury of lower back, initial encounter: Secondary | ICD-10-CM | POA: Diagnosis not present

## 2016-02-06 DIAGNOSIS — R0781 Pleurodynia: Secondary | ICD-10-CM

## 2016-02-06 MED ORDER — HYDROCODONE-ACETAMINOPHEN 5-325 MG PO TABS: 1.0000 | ORAL_TABLET | Freq: Every evening | ORAL | Status: DC | PRN

## 2016-02-06 NOTE — Progress Notes (Signed)
  Subjective:    CC: Fall  HPI: This is a pleasant 60 year old female, she fell off of a barstool when it collapsed, injuring the right side of her hip, and right rib cage. Pain is minimal, but mostly along the right paralumbar muscles and right rib cage. She does endorse some tingling in both hands and fingers, as well as in the outer 3 toes on her left foot. No bowel or bladder dysfunction, saddle numbness, no constitutional symptoms.  She also endorses some loss of hair, as well as multiple cysts on her ovaries at a recent ultrasound with her OB/GYN. She tells me she has had her testosterone levels checked in the past and they were high, has never been put on metformin.  Past medical history, Surgical history, Family history not pertinant except as noted below, Social history, Allergies, and medications have been entered into the medical record, reviewed, and no changes needed.   Review of Systems: No fevers, chills, night sweats, weight loss, chest pain, or shortness of breath.   Objective:    General: Well Developed, well nourished, and in no acute distress.  Neuro: Alert and oriented x3, extra-ocular muscles intact, sensation grossly intact.  HEENT: Normocephalic, atraumatic, pupils equal round reactive to light, neck supple, no masses, no lymphadenopathy, thyroid nonpalpable.  Skin: Warm and dry, no rashes. Cardiac: Regular rate and rhythm, no murmurs rubs or gallops, no lower extremity edema.  Respiratory: Clear to auscultation bilaterally. Not using accessory muscles, speaking in full sentences. Bruising and tenderness over the right posterior chest wall. Back Exam:  Inspection: Unremarkable  Motion: Flexion 45 deg, Extension 45 deg, Side Bending to 45 deg bilaterally,  Rotation to 45 deg bilaterally  SLR laying: Negative  XSLR laying: Negative  Palpable tenderness: Right paralumbar, cervical, and thoracic musculature. FABER: negative. Sensory change: Gross sensation intact to  all lumbar and sacral dermatomes.  Reflexes: 2+ at both patellar tendons, 2+ at achilles tendons, Babinski's downgoing.  Strength at foot  Plantar-flexion: 5/5 Dorsi-flexion: 5/5 Eversion: 5/5 Inversion: 5/5  Leg strength  Quad: 5/5 Hamstring: 5/5 Hip flexor: 5/5 Hip abductors: 5/5  Gait unremarkable.  Impression and Recommendations:    I spent 25 minutes with this patient, greater than 50% was face-to-face time counseling regarding the above diagnoses

## 2016-02-06 NOTE — Assessment & Plan Note (Addendum)
Per patient, multiple cysts in both ovaries on ultrasound. Testosterone levels were high, she is having hair loss, she is also postmenopausal. Rechecking testosterone levels, we will consider metformin treatment if still elevated. Her OB/GYN did check a CA125, awaiting results.  Adding metformin, recheck in 1 month.

## 2016-02-06 NOTE — Assessment & Plan Note (Signed)
With some bruising on the thigh and thorax on the right. Also with some tingling in the hands, and left third through fifth toes. X-rays of the cervical, thoracic, lumbar spine as well as right rib series. Short course of hydrocodone.

## 2016-02-07 ENCOUNTER — Other Ambulatory Visit: Payer: Self-pay | Admitting: Sports Medicine

## 2016-02-07 LAB — TESTOSTERONE: Testosterone: 165 ng/dL

## 2016-02-08 MED ORDER — METFORMIN HCL ER 500 MG PO TB24: 500.0000 mg | ORAL_TABLET | Freq: Every day | ORAL | Status: DC

## 2016-02-08 NOTE — Addendum Note (Signed)
Addended by: Monica BectonHEKKEKANDAM, Demia Viera J on: 02/08/2016 11:12 AM   Modules accepted: Orders

## 2016-02-09 DIAGNOSIS — R102 Pelvic and perineal pain: Secondary | ICD-10-CM | POA: Diagnosis not present

## 2016-02-09 DIAGNOSIS — M6240 Contracture of muscle, unspecified site: Secondary | ICD-10-CM | POA: Diagnosis not present

## 2016-02-09 DIAGNOSIS — N393 Stress incontinence (female) (male): Secondary | ICD-10-CM | POA: Diagnosis not present

## 2016-03-05 ENCOUNTER — Ambulatory Visit (INDEPENDENT_AMBULATORY_CARE_PROVIDER_SITE_OTHER): Payer: BLUE CROSS/BLUE SHIELD | Admitting: Sports Medicine

## 2016-03-05 ENCOUNTER — Encounter: Payer: Self-pay | Admitting: Sports Medicine

## 2016-03-05 DIAGNOSIS — E282 Polycystic ovarian syndrome: Secondary | ICD-10-CM | POA: Diagnosis not present

## 2016-03-05 DIAGNOSIS — F418 Other specified anxiety disorders: Secondary | ICD-10-CM | POA: Diagnosis not present

## 2016-03-05 DIAGNOSIS — M170 Bilateral primary osteoarthritis of knee: Secondary | ICD-10-CM | POA: Diagnosis not present

## 2016-03-05 DIAGNOSIS — F32A Depression, unspecified: Secondary | ICD-10-CM

## 2016-03-05 DIAGNOSIS — G4700 Insomnia, unspecified: Secondary | ICD-10-CM

## 2016-03-05 DIAGNOSIS — F419 Anxiety disorder, unspecified: Principal | ICD-10-CM

## 2016-03-05 DIAGNOSIS — F329 Major depressive disorder, single episode, unspecified: Secondary | ICD-10-CM

## 2016-03-05 LAB — COMPREHENSIVE METABOLIC PANEL
ALT: 13 U/L (ref 6–29)
AST: 17 U/L (ref 10–35)
Alkaline Phosphatase: 53 U/L (ref 33–130)
BUN: 13 mg/dL (ref 7–25)
Glucose, Bld: 96 mg/dL (ref 65–99)
Total Bilirubin: 0.5 mg/dL (ref 0.2–1.2)

## 2016-03-05 LAB — CBC
HCT: 42.3 % (ref 35.0–45.0)
Hemoglobin: 13.9 g/dL (ref 11.7–15.5)
MCH: 30.9 pg (ref 27.0–33.0)
MCHC: 32.9 g/dL (ref 32.0–36.0)
MCV: 94 fL (ref 80.0–100.0)
MPV: 11.8 fL (ref 7.5–12.5)
Platelets: 234 10*3/uL (ref 140–400)
RBC: 4.5 MIL/uL (ref 3.80–5.10)
RDW: 13.1 % (ref 11.0–15.0)
WBC: 5.6 K/uL (ref 3.8–10.8)

## 2016-03-05 LAB — COMPREHENSIVE METABOLIC PANEL WITH GFR
Albumin: 4.4 g/dL (ref 3.6–5.1)
CO2: 26 mmol/L (ref 20–31)
Calcium: 9.6 mg/dL (ref 8.6–10.4)
Chloride: 104 mmol/L (ref 98–110)
Creat: 0.74 mg/dL (ref 0.50–1.05)
Potassium: 4.4 mmol/L (ref 3.5–5.3)
Sodium: 139 mmol/L (ref 135–146)
Total Protein: 6.6 g/dL (ref 6.1–8.1)

## 2016-03-05 LAB — TSH: TSH: 2.51 m[IU]/L

## 2016-03-05 MED ORDER — TRAZODONE HCL 50 MG PO TABS: 50.0000 mg | ORAL_TABLET | Freq: Every day | ORAL | 1 refills | Status: DC

## 2016-03-05 NOTE — Progress Notes (Signed)
  Subjective:    CC: Follow-up  HPI: Hair loss: Has been on testosterone pellets from another provider. These are essentially resolved, but she also has PCOS, we started metformin at the last visit and she is due to recheck.  Left leg pain: Known left knee osteoarthritis, now with recurrence of pain on the posterior lateral joint line. Agrees to discuss this at a future visit.  Depression: Increasing anhedonia, insomnia, fatigue. Not taking any antidepressants right now. Only using Sonata for sleep.  Past medical history, Surgical history, Family history not pertinant except as noted below, Social history, Allergies, and medications have been entered into the medical record, reviewed, and no changes needed.   Review of Systems: No fevers, chills, night sweats, weight loss, chest pain, or shortness of breath.   Objective:    General: Well Developed, well nourished, and in no acute distress.  Neuro: Alert and oriented x3, extra-ocular muscles intact, sensation grossly intact.  HEENT: Normocephalic, atraumatic, pupils equal round reactive to light, neck supple, no masses, no lymphadenopathy, thyroid nonpalpable.  Skin: Warm and dry, no rashes. Cardiac: Regular rate and rhythm, no murmurs rubs or gallops, no lower extremity edema.  Respiratory: Clear to auscultation bilaterally. Not using accessory muscles, speaking in full sentences.  Impression and Recommendations:    Anxiety and depression With significant anhedonia and insomnia. Adding trazodone 50 mg at bedtime, return in one month.  Polycystic ovary syndrome Rechecking testosterone levels and other labs. She did have a testosterone implant which was likely the cause of her thinning hair, she will avoid testosterone and estrogen implants for now.  Insomnia Adding trazodone, this is depression related, she will discontinue all other sleeping medications.  Osteoarthritis of both knees Worse in the left knee, we will manage this  in further detail at the next visit.  I spent 25 minutes with this patient, greater than 50% was face-to-face time counseling regarding the above diagnoses

## 2016-03-05 NOTE — Assessment & Plan Note (Signed)
Adding trazodone, this is depression related, she will discontinue all other sleeping medications.

## 2016-03-05 NOTE — Assessment & Plan Note (Signed)
With significant anhedonia and insomnia. Adding trazodone 50 mg at bedtime, return in one month.

## 2016-03-05 NOTE — Assessment & Plan Note (Signed)
Rechecking testosterone levels and other labs. She did have a testosterone implant which was likely the cause of her thinning hair, she will avoid testosterone and estrogen implants for now.

## 2016-03-05 NOTE — Assessment & Plan Note (Signed)
Worse in the left knee, we will manage this in further detail at the next visit.

## 2016-03-06 LAB — TESTOSTERONE: Testosterone: 117 ng/dL

## 2016-03-08 DIAGNOSIS — N393 Stress incontinence (female) (male): Secondary | ICD-10-CM | POA: Diagnosis not present

## 2016-03-08 DIAGNOSIS — M6240 Contracture of muscle, unspecified site: Secondary | ICD-10-CM | POA: Diagnosis not present

## 2016-03-11 ENCOUNTER — Other Ambulatory Visit: Payer: Self-pay | Admitting: Sports Medicine

## 2016-03-12 ENCOUNTER — Other Ambulatory Visit: Payer: Self-pay | Admitting: Sports Medicine

## 2016-03-20 ENCOUNTER — Other Ambulatory Visit: Payer: Self-pay | Admitting: Sports Medicine

## 2016-03-20 DIAGNOSIS — E663 Overweight: Secondary | ICD-10-CM

## 2016-04-02 ENCOUNTER — Ambulatory Visit: Payer: BLUE CROSS/BLUE SHIELD | Admitting: Sports Medicine

## 2016-04-14 ENCOUNTER — Other Ambulatory Visit: Payer: Self-pay | Admitting: Sports Medicine

## 2016-04-26 ENCOUNTER — Other Ambulatory Visit: Payer: Self-pay | Admitting: Sports Medicine

## 2016-04-26 DIAGNOSIS — N393 Stress incontinence (female) (male): Secondary | ICD-10-CM | POA: Diagnosis not present

## 2016-04-26 DIAGNOSIS — E663 Overweight: Secondary | ICD-10-CM

## 2016-04-26 DIAGNOSIS — M6240 Contracture of muscle, unspecified site: Secondary | ICD-10-CM | POA: Diagnosis not present

## 2016-04-26 DIAGNOSIS — Z780 Asymptomatic menopausal state: Secondary | ICD-10-CM

## 2016-05-02 ENCOUNTER — Other Ambulatory Visit: Payer: Self-pay | Admitting: Sports Medicine

## 2016-05-02 DIAGNOSIS — F329 Major depressive disorder, single episode, unspecified: Secondary | ICD-10-CM

## 2016-05-02 DIAGNOSIS — F32A Depression, unspecified: Secondary | ICD-10-CM

## 2016-05-02 DIAGNOSIS — F419 Anxiety disorder, unspecified: Principal | ICD-10-CM

## 2016-05-14 ENCOUNTER — Other Ambulatory Visit: Payer: Self-pay | Admitting: Sports Medicine

## 2016-05-21 DIAGNOSIS — N951 Menopausal and female climacteric states: Secondary | ICD-10-CM | POA: Diagnosis not present

## 2016-05-27 ENCOUNTER — Other Ambulatory Visit: Payer: Self-pay | Admitting: Sports Medicine

## 2016-05-27 DIAGNOSIS — M6240 Contracture of muscle, unspecified site: Secondary | ICD-10-CM | POA: Diagnosis not present

## 2016-05-27 DIAGNOSIS — N393 Stress incontinence (female) (male): Secondary | ICD-10-CM | POA: Diagnosis not present

## 2016-05-27 DIAGNOSIS — E663 Overweight: Secondary | ICD-10-CM

## 2016-06-01 DIAGNOSIS — N951 Menopausal and female climacteric states: Secondary | ICD-10-CM | POA: Diagnosis not present

## 2016-06-01 DIAGNOSIS — E538 Deficiency of other specified B group vitamins: Secondary | ICD-10-CM | POA: Diagnosis not present

## 2016-06-12 ENCOUNTER — Other Ambulatory Visit: Payer: Self-pay | Admitting: Sports Medicine

## 2016-06-12 DIAGNOSIS — F419 Anxiety disorder, unspecified: Principal | ICD-10-CM

## 2016-06-12 DIAGNOSIS — F32A Depression, unspecified: Secondary | ICD-10-CM

## 2016-06-12 DIAGNOSIS — F329 Major depressive disorder, single episode, unspecified: Secondary | ICD-10-CM

## 2016-06-12 DIAGNOSIS — Z780 Asymptomatic menopausal state: Secondary | ICD-10-CM

## 2016-07-05 DIAGNOSIS — Z113 Encounter for screening for infections with a predominantly sexual mode of transmission: Secondary | ICD-10-CM | POA: Diagnosis not present

## 2016-07-05 DIAGNOSIS — R1030 Lower abdominal pain, unspecified: Secondary | ICD-10-CM | POA: Diagnosis not present

## 2016-07-08 ENCOUNTER — Other Ambulatory Visit: Payer: Self-pay | Admitting: Sports Medicine

## 2016-07-08 DIAGNOSIS — F419 Anxiety disorder, unspecified: Principal | ICD-10-CM

## 2016-07-08 DIAGNOSIS — F329 Major depressive disorder, single episode, unspecified: Secondary | ICD-10-CM

## 2016-07-08 DIAGNOSIS — E663 Overweight: Secondary | ICD-10-CM

## 2016-07-08 DIAGNOSIS — F32A Depression, unspecified: Secondary | ICD-10-CM

## 2016-07-08 NOTE — Telephone Encounter (Signed)
Too soon for Xanax.

## 2016-07-13 ENCOUNTER — Other Ambulatory Visit: Payer: Self-pay | Admitting: Sports Medicine

## 2016-07-16 DIAGNOSIS — R102 Pelvic and perineal pain: Secondary | ICD-10-CM | POA: Diagnosis not present

## 2016-07-16 DIAGNOSIS — N83201 Unspecified ovarian cyst, right side: Secondary | ICD-10-CM | POA: Diagnosis not present

## 2016-07-16 DIAGNOSIS — N83202 Unspecified ovarian cyst, left side: Secondary | ICD-10-CM | POA: Diagnosis not present

## 2016-07-20 ENCOUNTER — Other Ambulatory Visit: Payer: Self-pay | Admitting: Sports Medicine

## 2016-07-20 ENCOUNTER — Encounter: Payer: Self-pay | Admitting: Sports Medicine

## 2016-07-20 ENCOUNTER — Ambulatory Visit (INDEPENDENT_AMBULATORY_CARE_PROVIDER_SITE_OTHER): Payer: BLUE CROSS/BLUE SHIELD | Admitting: Sports Medicine

## 2016-07-20 VITALS — BP 152/81 | HR 65 | Resp 16 | Wt 146.8 lb

## 2016-07-20 DIAGNOSIS — Z299 Encounter for prophylactic measures, unspecified: Secondary | ICD-10-CM | POA: Diagnosis not present

## 2016-07-20 DIAGNOSIS — Z23 Encounter for immunization: Secondary | ICD-10-CM | POA: Diagnosis not present

## 2016-07-20 DIAGNOSIS — F5101 Primary insomnia: Secondary | ICD-10-CM | POA: Diagnosis not present

## 2016-07-20 DIAGNOSIS — R635 Abnormal weight gain: Secondary | ICD-10-CM | POA: Diagnosis not present

## 2016-07-20 DIAGNOSIS — Z20828 Contact with and (suspected) exposure to other viral communicable diseases: Secondary | ICD-10-CM | POA: Diagnosis not present

## 2016-07-20 DIAGNOSIS — R229 Localized swelling, mass and lump, unspecified: Secondary | ICD-10-CM

## 2016-07-20 MED ORDER — TRAZODONE HCL 100 MG PO TABS: 100.0000 mg | ORAL_TABLET | Freq: Every day | ORAL | 3 refills | Status: DC

## 2016-07-20 NOTE — Assessment & Plan Note (Signed)
Adding STD testing. She had an HSV-2 test that was negative, and then a subsequent one was positive. Retesting today.

## 2016-07-20 NOTE — Progress Notes (Signed)
  Subjective:    CC: Multiple issues  HPI: Insomnia: Never followed up after adding trazodone, she's only taking 50 mg daily.  Subcutaneous nodules: Underwent a minimally invasive procedure in the abdominal region to minimize her abdominal fat, unfortunately was left with several tender subcutaneous nodules. She is agreeable to discuss this with a board-certified plastic surgeon.  Weight gain: Not obese, at the borderline between overweight and normal, we did phentermine in the past. She is agreeable to discuss this with nutrition rather than start a medication for now.  Past medical history:  Negative.  See flowsheet/record as well for more information.  Surgical history: Negative.  See flowsheet/record as well for more information.  Family history: Negative.  See flowsheet/record as well for more information.  Social history: Negative.  See flowsheet/record as well for more information.  Allergies, and medications have been entered into the medical record, reviewed, and no changes needed.   Review of Systems: No fevers, chills, night sweats, weight loss, chest pain, or shortness of breath.   Objective:    General: Well Developed, well nourished, and in no acute distress.  Neuro: Alert and oriented x3, extra-ocular muscles intact, sensation grossly intact.  HEENT: Normocephalic, atraumatic, pupils equal round reactive to light, neck supple, no masses, no lymphadenopathy, thyroid nonpalpable.  Skin: Warm and dry, no rashes. Cardiac: Regular rate and rhythm, no murmurs rubs or gallops, no lower extremity edema.  Respiratory: Clear to auscultation bilaterally. Not using accessory muscles, speaking in full sentences. Abdomen: Multiple moderate to large sized subcutaneous nodules that are fairly tender to palpation, no evidence of abscess or cellulitis.  Impression and Recommendations:    Insomnia Increase trazodone to 100 mg daily at bedtime, return in one month, also with some  depressive symptoms. Trazodone being an atypical antidepressant will help her symptoms, we did discuss the need to follow-up regularly for dose adjustments.  Preventive measure Adding STD testing. She had an HSV-2 test that was negative, and then a subsequent one was positive. Retesting today.  Abnormal weight gain Discontinued to gain weight despite counseling, medication. Referral to nutrition, discontinue all medication, her body mass index is at the borderline normal and overweight. She will also restart her Topamax.  Multiple skin nodules With excessive abdominal skin, she underwent a minimally invasive procedure, and now has multiple subcutaneous nodules. I would like her to touch base with a board-certified plastic surgeon for consideration of abdominoplasty.

## 2016-07-20 NOTE — Assessment & Plan Note (Signed)
Increase trazodone to 100 mg daily at bedtime, return in one month, also with some depressive symptoms. Trazodone being an atypical antidepressant will help her symptoms, we did discuss the need to follow-up regularly for dose adjustments.

## 2016-07-20 NOTE — Assessment & Plan Note (Signed)
With excessive abdominal skin, she underwent a minimally invasive procedure, and now has multiple subcutaneous nodules. I would like her to touch base with a board-certified plastic surgeon for consideration of abdominoplasty.

## 2016-07-20 NOTE — Assessment & Plan Note (Addendum)
Discontinued to gain weight despite counseling, medication. Referral to nutrition, discontinue all medication, her body mass index is at the borderline normal and overweight. She will also restart her Topamax.

## 2016-07-22 LAB — HSV 1/2 AB (IGM), IFA W/RFLX TITER
HSV 1 IgM Screen: NEGATIVE
HSV 2 IgM Screen: NEGATIVE

## 2016-07-23 NOTE — Addendum Note (Signed)
Addended by: Monica BectonHEKKEKANDAM, THOMAS J on: 07/23/2016 09:27 AM   Modules accepted: Orders

## 2016-07-27 LAB — HSV 2 ANTIBODY, IGG: HSV 2 Glycoprotein G Ab, IgG: 18.6 {index} — ABNORMAL HIGH (ref <0.90)

## 2016-07-30 DIAGNOSIS — R635 Abnormal weight gain: Secondary | ICD-10-CM | POA: Diagnosis not present

## 2016-08-10 ENCOUNTER — Other Ambulatory Visit: Payer: Self-pay | Admitting: Sports Medicine

## 2016-08-16 ENCOUNTER — Ambulatory Visit: Payer: BLUE CROSS/BLUE SHIELD | Admitting: Sports Medicine

## 2016-09-13 ENCOUNTER — Other Ambulatory Visit: Payer: Self-pay | Admitting: Sports Medicine

## 2016-09-13 DIAGNOSIS — Z1231 Encounter for screening mammogram for malignant neoplasm of breast: Secondary | ICD-10-CM | POA: Diagnosis not present

## 2016-09-13 LAB — HM MAMMOGRAPHY

## 2016-09-15 DIAGNOSIS — K122 Cellulitis and abscess of mouth: Secondary | ICD-10-CM | POA: Diagnosis not present

## 2016-09-19 ENCOUNTER — Encounter: Payer: Self-pay | Admitting: Sports Medicine

## 2016-09-27 ENCOUNTER — Other Ambulatory Visit: Payer: Self-pay | Admitting: Sports Medicine

## 2016-09-27 DIAGNOSIS — M170 Bilateral primary osteoarthritis of knee: Secondary | ICD-10-CM

## 2016-10-12 DIAGNOSIS — N951 Menopausal and female climacteric states: Secondary | ICD-10-CM | POA: Diagnosis not present

## 2016-10-14 DIAGNOSIS — E538 Deficiency of other specified B group vitamins: Secondary | ICD-10-CM | POA: Diagnosis not present

## 2016-10-14 DIAGNOSIS — N951 Menopausal and female climacteric states: Secondary | ICD-10-CM | POA: Diagnosis not present

## 2016-11-08 ENCOUNTER — Other Ambulatory Visit: Payer: Self-pay | Admitting: Sports Medicine

## 2016-11-08 ENCOUNTER — Other Ambulatory Visit: Payer: Self-pay

## 2016-11-08 DIAGNOSIS — F5101 Primary insomnia: Secondary | ICD-10-CM

## 2016-11-08 MED ORDER — TRAZODONE HCL 100 MG PO TABS: 100.0000 mg | ORAL_TABLET | Freq: Every day | ORAL | 3 refills | Status: DC

## 2016-11-08 MED ORDER — ALPRAZOLAM 1 MG PO TABS: ORAL_TABLET | ORAL | 0 refills | Status: DC

## 2016-11-15 ENCOUNTER — Other Ambulatory Visit: Payer: Self-pay | Admitting: Sports Medicine

## 2016-11-15 DIAGNOSIS — E663 Overweight: Secondary | ICD-10-CM

## 2017-01-11 DIAGNOSIS — N951 Menopausal and female climacteric states: Secondary | ICD-10-CM | POA: Diagnosis not present

## 2017-01-13 DIAGNOSIS — G4709 Other insomnia: Secondary | ICD-10-CM | POA: Diagnosis not present

## 2017-01-13 DIAGNOSIS — N329 Bladder disorder, unspecified: Secondary | ICD-10-CM | POA: Diagnosis not present

## 2017-01-13 DIAGNOSIS — N958 Other specified menopausal and perimenopausal disorders: Secondary | ICD-10-CM | POA: Diagnosis not present

## 2017-01-13 DIAGNOSIS — R5383 Other fatigue: Secondary | ICD-10-CM | POA: Diagnosis not present

## 2017-02-02 ENCOUNTER — Other Ambulatory Visit: Payer: Self-pay | Admitting: Sports Medicine

## 2017-02-07 ENCOUNTER — Other Ambulatory Visit: Payer: Self-pay | Admitting: Sports Medicine

## 2017-02-11 ENCOUNTER — Other Ambulatory Visit: Payer: Self-pay | Admitting: Sports Medicine

## 2017-02-11 DIAGNOSIS — E663 Overweight: Secondary | ICD-10-CM

## 2017-03-09 ENCOUNTER — Other Ambulatory Visit: Payer: Self-pay | Admitting: Sports Medicine

## 2017-03-09 DIAGNOSIS — F5101 Primary insomnia: Secondary | ICD-10-CM

## 2017-03-10 ENCOUNTER — Other Ambulatory Visit: Payer: Self-pay | Admitting: Sports Medicine

## 2017-03-13 ENCOUNTER — Other Ambulatory Visit: Payer: Self-pay | Admitting: Sports Medicine

## 2017-03-13 DIAGNOSIS — F5101 Primary insomnia: Secondary | ICD-10-CM

## 2017-03-13 DIAGNOSIS — E663 Overweight: Secondary | ICD-10-CM

## 2017-04-07 ENCOUNTER — Other Ambulatory Visit: Payer: Self-pay | Admitting: Sports Medicine

## 2017-04-07 DIAGNOSIS — F5101 Primary insomnia: Secondary | ICD-10-CM

## 2017-04-26 ENCOUNTER — Other Ambulatory Visit: Payer: Self-pay | Admitting: Sports Medicine

## 2017-04-27 NOTE — Telephone Encounter (Signed)
Katherine Lawson called and states she left a message on a voicemail on Monday and Tuesday for a refill on Xanax. She states she left it on the voicemail box of Dr Lucienne Minks nurse.   I did advise patient she needs a follow up visit. I scheduled her an appointment on 05/24/17.  She would like refill to go to [address]

## 2017-04-28 MED ORDER — ALPRAZOLAM 1 MG PO TABS: 1.0000 mg | ORAL_TABLET | Freq: Every day | ORAL | 0 refills | Status: DC | PRN

## 2017-05-03 ENCOUNTER — Other Ambulatory Visit: Payer: Self-pay | Admitting: Sports Medicine

## 2017-05-03 DIAGNOSIS — F5101 Primary insomnia: Secondary | ICD-10-CM

## 2017-05-04 ENCOUNTER — Other Ambulatory Visit: Payer: Self-pay | Admitting: Sports Medicine

## 2017-05-18 DIAGNOSIS — I8393 Asymptomatic varicose veins of bilateral lower extremities: Secondary | ICD-10-CM | POA: Diagnosis not present

## 2017-05-18 DIAGNOSIS — L821 Other seborrheic keratosis: Secondary | ICD-10-CM | POA: Diagnosis not present

## 2017-05-18 DIAGNOSIS — L814 Other melanin hyperpigmentation: Secondary | ICD-10-CM | POA: Diagnosis not present

## 2017-05-18 DIAGNOSIS — D2371 Other benign neoplasm of skin of right lower limb, including hip: Secondary | ICD-10-CM | POA: Diagnosis not present

## 2017-05-24 ENCOUNTER — Ambulatory Visit: Payer: BLUE CROSS/BLUE SHIELD

## 2017-05-31 ENCOUNTER — Other Ambulatory Visit: Payer: Self-pay | Admitting: Sports Medicine

## 2017-05-31 DIAGNOSIS — F5101 Primary insomnia: Secondary | ICD-10-CM

## 2017-06-09 ENCOUNTER — Encounter: Payer: Self-pay | Admitting: Sports Medicine

## 2017-06-09 ENCOUNTER — Ambulatory Visit (INDEPENDENT_AMBULATORY_CARE_PROVIDER_SITE_OTHER): Payer: BLUE CROSS/BLUE SHIELD

## 2017-06-09 ENCOUNTER — Ambulatory Visit: Payer: BLUE CROSS/BLUE SHIELD | Admitting: Sports Medicine

## 2017-06-09 ENCOUNTER — Other Ambulatory Visit: Payer: Self-pay | Admitting: Sports Medicine

## 2017-06-09 DIAGNOSIS — R059 Cough, unspecified: Secondary | ICD-10-CM | POA: Insufficient documentation

## 2017-06-09 DIAGNOSIS — R229 Localized swelling, mass and lump, unspecified: Secondary | ICD-10-CM

## 2017-06-09 DIAGNOSIS — E785 Hyperlipidemia, unspecified: Secondary | ICD-10-CM | POA: Diagnosis not present

## 2017-06-09 DIAGNOSIS — F419 Anxiety disorder, unspecified: Secondary | ICD-10-CM

## 2017-06-09 DIAGNOSIS — F5101 Primary insomnia: Secondary | ICD-10-CM | POA: Diagnosis not present

## 2017-06-09 DIAGNOSIS — R05 Cough: Secondary | ICD-10-CM

## 2017-06-09 DIAGNOSIS — R0602 Shortness of breath: Secondary | ICD-10-CM

## 2017-06-09 DIAGNOSIS — F32A Depression, unspecified: Secondary | ICD-10-CM

## 2017-06-09 DIAGNOSIS — Z780 Asymptomatic menopausal state: Secondary | ICD-10-CM

## 2017-06-09 DIAGNOSIS — F329 Major depressive disorder, single episode, unspecified: Secondary | ICD-10-CM

## 2017-06-09 MED ORDER — VORTIOXETINE HBR 10 MG PO TABS: 1.0000 | ORAL_TABLET | Freq: Every day | ORAL | 3 refills | Status: DC

## 2017-06-09 MED ORDER — ATORVASTATIN CALCIUM 40 MG PO TABS: 40.0000 mg | ORAL_TABLET | Freq: Every day | ORAL | 3 refills | Status: DC

## 2017-06-09 MED ORDER — AZITHROMYCIN 250 MG PO TABS: ORAL_TABLET | ORAL | 0 refills | Status: DC

## 2017-06-09 MED ORDER — ALPRAZOLAM 1 MG PO TABS: 1.0000 mg | ORAL_TABLET | Freq: Every day | ORAL | 0 refills | Status: DC | PRN

## 2017-06-09 NOTE — Assessment & Plan Note (Signed)
Uncontrolled anxiety and depression. Switching from Prozac to Trintellix. Return in 1 month for PHQ/GAD

## 2017-06-09 NOTE — Progress Notes (Signed)
  Subjective:    CC: Follow-up multiple issues, cough  HPI: Coughing: Present for the past 3 days, productive, no fevers or chills but she does have mild fatigue and malaise.  No GI symptoms, no shortness of breath, no chest pain.  Hyperlipidemia: Has been off of her atorvastatin for some time now.  Anxiety and depression: Insufficient improvement in anxiety and depressive symptoms, no suicidal or homicidal ideation, currently taking trazodone, has been off of eszopiclone now for some time.  She did have a moderate response to Prozac but noted intolerable weight gain and anorgasmia.  Subcutaneous skin nodules: Noted over the lower abdomen, we did refer her to a board-certified plastic surgeon who recommended repeat abdominoplasty, not ready for this yet.  Past medical history:  Negative.  See flowsheet/record as well for more information.  Surgical history: Negative.  See flowsheet/record as well for more information.  Family history: Negative.  See flowsheet/record as well for more information.  Social history: Negative.  See flowsheet/record as well for more information.  Allergies, and medications have been entered into the medical record, reviewed, and no changes needed.   Review of Systems: No fevers, chills, night sweats, weight loss, chest pain, or shortness of breath.   Objective:    General: Well Developed, well nourished, and in no acute distress.  Neuro: Alert and oriented x3, extra-ocular muscles intact, sensation grossly intact.  HEENT: Normocephalic, atraumatic, pupils equal round reactive to light, neck supple, no masses, no lymphadenopathy, thyroid nonpalpable.  Oropharynx, nasopharynx, ear canals unremarkable. Skin: Warm and dry, no rashes. Cardiac: Regular rate and rhythm, no murmurs rubs or gallops, no lower extremity edema.  Respiratory: Good air movement in the left lung fields, poor air movement on the right with crackles another coarse sounds. Not using accessory  muscles, speaking in full sentences.  Impression and Recommendations:    Cough Persistent cough, productive with coarse sounds in the right middle lobe.   Suspect community-acquired pneumonia. Adding azithromycin, chest x-ray.  Hyperlipidemia Restarting atorvastatin.  Anxiety and depression Uncontrolled anxiety and depression. Switching from Prozac to Trintellix. Return in 1 month for PHQ/GAD  Insomnia Discontinue eszopiclone, this is likely related to uncontrolled depression.   Multiple skin nodules Board certified plastic surgery, he has recommended an additional abdominoplasty.  ___________________________________________ Ihor Austinhomas J. Benjamin Stainhekkekandam, M.D., ABFM., CAQSM. Primary Care and Sports Medicine [facility]  Adjunct Instructor of Family Medicine  [employer]

## 2017-06-09 NOTE — Assessment & Plan Note (Signed)
Board certified plastic surgery, he has recommended an additional abdominoplasty.

## 2017-06-09 NOTE — Assessment & Plan Note (Signed)
Discontinue eszopiclone, this is likely related to uncontrolled depression.

## 2017-06-09 NOTE — Assessment & Plan Note (Signed)
Restarting atorvastatin. 

## 2017-06-09 NOTE — Assessment & Plan Note (Addendum)
Persistent cough, productive with coarse sounds in the right middle lobe.   Suspect community-acquired pneumonia. Adding azithromycin, chest x-ray.

## 2017-06-10 ENCOUNTER — Telehealth: Payer: Self-pay | Admitting: Sports Medicine

## 2017-06-10 NOTE — Telephone Encounter (Signed)
Received fax for PA on Trintellix sent through cover my meds and authorization is valid from 06/10/17 -06/08/2020. I will notify the pharmacy - CF

## 2017-07-05 ENCOUNTER — Other Ambulatory Visit: Payer: Self-pay | Admitting: Sports Medicine

## 2017-07-07 ENCOUNTER — Other Ambulatory Visit: Payer: Self-pay

## 2017-07-07 MED ORDER — ALPRAZOLAM 1 MG PO TABS: 1.0000 mg | ORAL_TABLET | Freq: Every day | ORAL | 0 refills | Status: DC | PRN

## 2017-07-07 NOTE — Progress Notes (Signed)
Done

## 2017-07-11 ENCOUNTER — Ambulatory Visit: Payer: BLUE CROSS/BLUE SHIELD | Admitting: Sports Medicine

## 2017-07-11 ENCOUNTER — Encounter: Payer: Self-pay | Admitting: Sports Medicine

## 2017-07-11 DIAGNOSIS — F419 Anxiety disorder, unspecified: Secondary | ICD-10-CM

## 2017-07-11 DIAGNOSIS — R635 Abnormal weight gain: Secondary | ICD-10-CM | POA: Diagnosis not present

## 2017-07-11 DIAGNOSIS — E785 Hyperlipidemia, unspecified: Secondary | ICD-10-CM

## 2017-07-11 DIAGNOSIS — F32A Depression, unspecified: Secondary | ICD-10-CM

## 2017-07-11 DIAGNOSIS — F329 Major depressive disorder, single episode, unspecified: Secondary | ICD-10-CM | POA: Diagnosis not present

## 2017-07-11 MED ORDER — PHENTERMINE HCL 37.5 MG PO TABS: ORAL_TABLET | ORAL | 0 refills | Status: DC

## 2017-07-11 MED ORDER — VORTIOXETINE HBR 10 MG PO TABS: 1.0000 | ORAL_TABLET | Freq: Every day | ORAL | 3 refills | Status: DC

## 2017-07-11 NOTE — Assessment & Plan Note (Signed)
Good response to Trintellix. Refilling medication at the current dose. No difficulty with orgasm.

## 2017-07-11 NOTE — Assessment & Plan Note (Addendum)
Has put on about 15 pounds, she would like to try phentermine again, refilling medication, return monthly for weight checks and refills. Able to shovel snow without any difficulty, negative treadmill stress test in 2015.

## 2017-07-11 NOTE — Progress Notes (Addendum)
  Subjective:    CC: Follow-up  HPI: Anxiety and depression: Fantastic response to Trintellix, no anorgasmia.  Happy with how things are going.  Abnormal weight gain: Would like some help losing weight again.  Past medical history:  Negative.  See flowsheet/record as well for more information.  Surgical history: Negative.  See flowsheet/record as well for more information.  Family history: Negative.  See flowsheet/record as well for more information.  Social history: Negative.  See flowsheet/record as well for more information.  Allergies, and medications have been entered into the medical record, reviewed, and no changes needed.   (To billers/coders, pertinent past medical, social, surgical, family history can be found in problem list, if problem list is marked as reviewed then this indicates that past medical, social, surgical, family history was also reviewed)  Review of Systems: No fevers, chills, night sweats, weight loss, chest pain, or shortness of breath.   Objective:    General: Well Developed, well nourished, and in no acute distress.  Neuro: Alert and oriented x3, extra-ocular muscles intact, sensation grossly intact.  HEENT: Normocephalic, atraumatic, pupils equal round reactive to light, neck supple, no masses, no lymphadenopathy, thyroid nonpalpable.  Skin: Warm and dry, no rashes. Cardiac: Regular rate and rhythm, no murmurs rubs or gallops, no lower extremity edema.  Respiratory: Clear to auscultation bilaterally. Not using accessory muscles, speaking in full sentences.  Impression and Recommendations:    Anxiety and depression Good response to Trintellix. Refilling medication at the current dose. No difficulty with orgasm.  Abnormal weight gain Has put on about 15 pounds, she would like to try phentermine again, refilling medication, return monthly for weight checks and refills. Able to shovel snow without any difficulty, negative treadmill stress test in  2015.  Hyperlipidemia Atorvastatin 40 mg called in.  I spent 25 minutes with this patient, greater than 50% was face-to-face time counseling regarding the above diagnoses ___________________________________________ Ihor Austinhomas J. Benjamin Stainhekkekandam, M.D., ABFM., CAQSM. Primary Care and Sports Medicine [facility]  Adjunct Instructor of Family Medicine  [employer]

## 2017-07-12 LAB — COMPREHENSIVE METABOLIC PANEL WITH GFR
AG Ratio: 1.6 (calc) (ref 1.0–2.5)
AST: 23 U/L (ref 10–35)
Albumin: 4.2 g/dL (ref 3.6–5.1)
Alkaline phosphatase (APISO): 57 U/L (ref 33–130)
CO2: 27 mmol/L (ref 20–32)
Chloride: 105 mmol/L (ref 98–110)
Globulin: 2.6 g/dL (ref 1.9–3.7)
Potassium: 4.5 mmol/L (ref 3.5–5.3)

## 2017-07-12 LAB — CBC
HCT: 40.4 % (ref 35.0–45.0)
Hemoglobin: 13.5 g/dL (ref 11.7–15.5)
MCH: 31.3 pg (ref 27.0–33.0)
MCHC: 33.4 g/dL (ref 32.0–36.0)
MCV: 93.7 fL (ref 80.0–100.0)
MPV: 11.5 fL (ref 7.5–12.5)
Platelets: 264 10*3/uL (ref 140–400)
RBC: 4.31 10*6/uL (ref 3.80–5.10)
RDW: 12.1 % (ref 11.0–15.0)
WBC: 5.3 10*3/uL (ref 3.8–10.8)

## 2017-07-12 LAB — COMPREHENSIVE METABOLIC PANEL
ALT: 26 U/L (ref 6–29)
BUN: 15 mg/dL (ref 7–25)
Calcium: 9.5 mg/dL (ref 8.6–10.4)
Creat: 0.8 mg/dL (ref 0.50–0.99)
Glucose, Bld: 91 mg/dL (ref 65–99)
Sodium: 139 mmol/L (ref 135–146)
Total Bilirubin: 0.5 mg/dL (ref 0.2–1.2)
Total Protein: 6.8 g/dL (ref 6.1–8.1)

## 2017-07-12 LAB — LIPID PANEL W/REFLEX DIRECT LDL
Cholesterol: 248 mg/dL — ABNORMAL HIGH (ref <200)
HDL: 43 mg/dL — ABNORMAL LOW (ref >50)
LDL Cholesterol (Calc): 145 mg/dL — ABNORMAL HIGH
Non-HDL Cholesterol (Calc): 205 mg/dL (calc) — ABNORMAL HIGH (ref <130)
Total CHOL/HDL Ratio: 5.8 (calc) — ABNORMAL HIGH (ref <5.0)
Triglycerides: 392 mg/dL — ABNORMAL HIGH (ref <150)

## 2017-07-12 LAB — HEMOGLOBIN A1C
Hgb A1c MFr Bld: 5.2 %{Hb} (ref <5.7)
Mean Plasma Glucose: 103 (calc)
eAG (mmol/L): 5.7 (calc)

## 2017-07-12 LAB — TSH: TSH: 1.11 m[IU]/L (ref 0.40–4.50)

## 2017-07-12 MED ORDER — ATORVASTATIN CALCIUM 40 MG PO TABS: 40.0000 mg | ORAL_TABLET | Freq: Every day | ORAL | 3 refills | Status: DC

## 2017-07-12 NOTE — Addendum Note (Signed)
Addended by: Monica BectonHEKKEKANDAM, Kenslei Hearty J on: 07/12/2017 05:06 PM   Modules accepted: Orders

## 2017-07-12 NOTE — Assessment & Plan Note (Signed)
Atorvastatin 40 mg called in.

## 2017-07-17 ENCOUNTER — Other Ambulatory Visit: Payer: Self-pay | Admitting: Sports Medicine

## 2017-07-17 DIAGNOSIS — F5101 Primary insomnia: Secondary | ICD-10-CM

## 2017-08-04 ENCOUNTER — Other Ambulatory Visit: Payer: Self-pay | Admitting: Sports Medicine

## 2017-08-08 ENCOUNTER — Ambulatory Visit: Payer: BLUE CROSS/BLUE SHIELD | Admitting: Sports Medicine

## 2017-08-08 ENCOUNTER — Other Ambulatory Visit: Payer: Self-pay | Admitting: Sports Medicine

## 2017-08-08 MED ORDER — ALPRAZOLAM 1 MG PO TABS: 1.0000 mg | ORAL_TABLET | Freq: Every day | ORAL | 0 refills | Status: DC | PRN

## 2017-08-08 NOTE — Telephone Encounter (Signed)
Done

## 2017-08-08 NOTE — Telephone Encounter (Signed)
Patient called cancelled appointment has not slept in 30 hrs and she is thinking it because of Trentalix depression med which has helped with her depression but not allowing her to sleep. Pt is out of Alprazam and that does help her sleep. Pt req a nurse to call back regarding refill request.

## 2017-08-15 ENCOUNTER — Encounter: Payer: Self-pay | Admitting: Sports Medicine

## 2017-08-15 ENCOUNTER — Ambulatory Visit: Payer: BLUE CROSS/BLUE SHIELD | Admitting: Sports Medicine

## 2017-08-15 DIAGNOSIS — R635 Abnormal weight gain: Secondary | ICD-10-CM

## 2017-08-15 MED ORDER — PHENTERMINE HCL 37.5 MG PO TABS: ORAL_TABLET | ORAL | 0 refills | Status: DC

## 2017-08-15 NOTE — Progress Notes (Signed)
  Subjective:    CC: Weight check  HPI: After the first month this pleasant 62 year old female has lost 6 pounds, happy with results.  I reviewed the past medical history, family history, social history, surgical history, and allergies today and no changes were needed.  Please see the problem list section below in epic for further details.  Past Medical History: Past Medical History:  Diagnosis Date  . Anxiety   . Arthritis   . Barrett's esophagus   . Hiatal hernia   . Hx of breast implants, bilateral 1990  . S/P partial hysterectomy 1980   Past Surgical History: Past Surgical History:  Procedure Laterality Date  . ABDOMINAL HYSTERECTOMY    . BREAST ENHANCEMENT SURGERY  1989  . CHOLECYSTECTOMY    . NOSE SURGERY    . sculpting     Thermal sculpting of abdominal adipose tissue  . WRIST SURGERY     Social History: Social History   Socioeconomic History  . Marital status: Widowed    Spouse name: None  . Number of children: None  . Years of education: None  . Highest education level: None  Social Needs  . Financial resource strain: None  . Food insecurity - worry: None  . Food insecurity - inability: None  . Transportation needs - medical: None  . Transportation needs - non-medical: None  Occupational History  . Occupation: Agricultural consultantvolunteer  Tobacco Use  . Smoking status: Never Smoker  . Smokeless tobacco: Never Used  Substance and Sexual Activity  . Alcohol use: Yes    Alcohol/week: 0.0 oz  . Drug use: No  . Sexual activity: Yes    Partners: Male  Other Topics Concern  . None  Social History Narrative  . None   Family History: Family History  Problem Relation Age of Onset  . Cancer Mother        melanoma  . Cancer Father        melonoma,bone  . Diabetes Mother   . Diabetes Father   . Cancer Maternal Grandmother        breast   Allergies: Allergies  Allergen Reactions  . Codeine    Medications: See med rec.  Review of Systems: No fevers, chills,  night sweats, weight loss, chest pain, or shortness of breath.   Objective:    General: Well Developed, well nourished, and in no acute distress.  Neuro: Alert and oriented x3, extra-ocular muscles intact, sensation grossly intact.  HEENT: Normocephalic, atraumatic, pupils equal round reactive to light, neck supple, no masses, no lymphadenopathy, thyroid nonpalpable.  Skin: Warm and dry, no rashes. Cardiac: Regular rate and rhythm, no murmurs rubs or gallops, no lower extremity edema.  Respiratory: Clear to auscultation bilaterally. Not using accessory muscles, speaking in full sentences.  Impression and Recommendations:    Abnormal weight gain 6 pound weight loss after the first month, refilling phentermine. ___________________________________________ Ihor Austinhomas J. Benjamin Stainhekkekandam, M.D., ABFM., CAQSM. Primary Care and Sports Medicine [facility]  Adjunct Instructor of Family Medicine  [employer]

## 2017-08-15 NOTE — Assessment & Plan Note (Signed)
6 pound weight loss after the first month, refilling phentermine.

## 2017-08-22 ENCOUNTER — Encounter: Payer: Self-pay | Admitting: Sports Medicine

## 2017-08-22 NOTE — Telephone Encounter (Signed)
I haven't seen him in 2.5 years, what diagnosis is he admitted for and what questions does she have?  (Any specific medical questions should be directed to his treating physicians)

## 2017-09-04 ENCOUNTER — Encounter: Payer: Self-pay | Admitting: Sports Medicine

## 2017-09-04 ENCOUNTER — Other Ambulatory Visit: Payer: Self-pay | Admitting: Sports Medicine

## 2017-09-05 MED ORDER — ALPRAZOLAM 1 MG PO TABS: 1.0000 mg | ORAL_TABLET | Freq: Every day | ORAL | 0 refills | Status: DC | PRN

## 2017-09-15 ENCOUNTER — Ambulatory Visit: Payer: BLUE CROSS/BLUE SHIELD | Admitting: Sports Medicine

## 2017-09-21 ENCOUNTER — Other Ambulatory Visit: Payer: Self-pay | Admitting: Sports Medicine

## 2017-09-21 DIAGNOSIS — Z780 Asymptomatic menopausal state: Secondary | ICD-10-CM

## 2017-10-03 ENCOUNTER — Other Ambulatory Visit: Payer: Self-pay | Admitting: Sports Medicine

## 2017-10-03 MED ORDER — ALPRAZOLAM 1 MG PO TABS: 1.0000 mg | ORAL_TABLET | Freq: Every day | ORAL | 0 refills | Status: DC | PRN

## 2017-10-12 ENCOUNTER — Other Ambulatory Visit: Payer: Self-pay | Admitting: Sports Medicine

## 2017-10-12 DIAGNOSIS — F32A Depression, unspecified: Secondary | ICD-10-CM

## 2017-10-12 DIAGNOSIS — F419 Anxiety disorder, unspecified: Principal | ICD-10-CM

## 2017-10-12 DIAGNOSIS — F329 Major depressive disorder, single episode, unspecified: Secondary | ICD-10-CM

## 2017-10-31 ENCOUNTER — Other Ambulatory Visit: Payer: Self-pay | Admitting: Sports Medicine

## 2017-11-01 MED ORDER — ALPRAZOLAM 1 MG PO TABS: 1.0000 mg | ORAL_TABLET | Freq: Every day | ORAL | 0 refills | Status: DC | PRN

## 2017-11-01 NOTE — Telephone Encounter (Signed)
Done

## 2017-11-10 ENCOUNTER — Other Ambulatory Visit: Payer: Self-pay | Admitting: Sports Medicine

## 2017-11-10 DIAGNOSIS — F329 Major depressive disorder, single episode, unspecified: Secondary | ICD-10-CM

## 2017-11-10 DIAGNOSIS — F419 Anxiety disorder, unspecified: Principal | ICD-10-CM

## 2017-11-10 DIAGNOSIS — F32A Depression, unspecified: Secondary | ICD-10-CM

## 2017-11-17 DIAGNOSIS — N951 Menopausal and female climacteric states: Secondary | ICD-10-CM | POA: Diagnosis not present

## 2017-11-17 DIAGNOSIS — N958 Other specified menopausal and perimenopausal disorders: Secondary | ICD-10-CM | POA: Diagnosis not present

## 2017-11-18 DIAGNOSIS — R5383 Other fatigue: Secondary | ICD-10-CM | POA: Diagnosis not present

## 2017-11-18 DIAGNOSIS — R6882 Decreased libido: Secondary | ICD-10-CM | POA: Diagnosis not present

## 2017-11-18 DIAGNOSIS — N958 Other specified menopausal and perimenopausal disorders: Secondary | ICD-10-CM | POA: Diagnosis not present

## 2017-11-18 DIAGNOSIS — G4709 Other insomnia: Secondary | ICD-10-CM | POA: Diagnosis not present

## 2017-12-01 ENCOUNTER — Other Ambulatory Visit: Payer: Self-pay | Admitting: Sports Medicine

## 2017-12-01 DIAGNOSIS — E663 Overweight: Secondary | ICD-10-CM

## 2017-12-02 ENCOUNTER — Other Ambulatory Visit: Payer: Self-pay | Admitting: Sports Medicine

## 2017-12-07 MED ORDER — TOPIRAMATE 100 MG PO TABS: 100.0000 mg | ORAL_TABLET | Freq: Every day | ORAL | 0 refills | Status: DC

## 2017-12-14 ENCOUNTER — Other Ambulatory Visit: Payer: Self-pay | Admitting: Sports Medicine

## 2017-12-14 DIAGNOSIS — F32A Depression, unspecified: Secondary | ICD-10-CM

## 2017-12-14 DIAGNOSIS — F419 Anxiety disorder, unspecified: Principal | ICD-10-CM

## 2017-12-14 DIAGNOSIS — F329 Major depressive disorder, single episode, unspecified: Secondary | ICD-10-CM

## 2018-01-01 ENCOUNTER — Other Ambulatory Visit: Payer: Self-pay | Admitting: Sports Medicine

## 2018-01-02 ENCOUNTER — Other Ambulatory Visit: Payer: Self-pay | Admitting: Sports Medicine

## 2018-01-03 ENCOUNTER — Encounter: Payer: Self-pay | Admitting: Sports Medicine

## 2018-01-12 ENCOUNTER — Other Ambulatory Visit: Payer: Self-pay | Admitting: Sports Medicine

## 2018-01-12 DIAGNOSIS — F32A Depression, unspecified: Secondary | ICD-10-CM

## 2018-01-12 DIAGNOSIS — F329 Major depressive disorder, single episode, unspecified: Secondary | ICD-10-CM

## 2018-01-12 DIAGNOSIS — F419 Anxiety disorder, unspecified: Principal | ICD-10-CM

## 2018-02-01 ENCOUNTER — Other Ambulatory Visit: Payer: Self-pay | Admitting: Sports Medicine

## 2018-02-01 NOTE — Telephone Encounter (Signed)
[facility] requesting RF on pt's Xanax.  Last RF sent 01-02-18 for #30 with no RF  RX pended, please send if appropriate

## 2018-02-20 ENCOUNTER — Other Ambulatory Visit: Payer: Self-pay | Admitting: Sports Medicine

## 2018-02-20 DIAGNOSIS — F329 Major depressive disorder, single episode, unspecified: Secondary | ICD-10-CM

## 2018-02-20 DIAGNOSIS — F419 Anxiety disorder, unspecified: Principal | ICD-10-CM

## 2018-02-20 DIAGNOSIS — F32A Depression, unspecified: Secondary | ICD-10-CM

## 2018-03-03 ENCOUNTER — Other Ambulatory Visit: Payer: Self-pay | Admitting: Sports Medicine

## 2018-03-03 DIAGNOSIS — E663 Overweight: Secondary | ICD-10-CM

## 2018-03-06 ENCOUNTER — Other Ambulatory Visit: Payer: Self-pay | Admitting: Sports Medicine

## 2018-03-14 DIAGNOSIS — N958 Other specified menopausal and perimenopausal disorders: Secondary | ICD-10-CM | POA: Diagnosis not present

## 2018-03-16 DIAGNOSIS — R5383 Other fatigue: Secondary | ICD-10-CM | POA: Diagnosis not present

## 2018-03-16 DIAGNOSIS — N958 Other specified menopausal and perimenopausal disorders: Secondary | ICD-10-CM | POA: Diagnosis not present

## 2018-03-21 ENCOUNTER — Other Ambulatory Visit: Payer: Self-pay | Admitting: Sports Medicine

## 2018-03-21 DIAGNOSIS — F419 Anxiety disorder, unspecified: Principal | ICD-10-CM

## 2018-03-21 DIAGNOSIS — F329 Major depressive disorder, single episode, unspecified: Secondary | ICD-10-CM

## 2018-03-21 DIAGNOSIS — F32A Depression, unspecified: Secondary | ICD-10-CM

## 2018-04-04 ENCOUNTER — Other Ambulatory Visit: Payer: Self-pay | Admitting: Sports Medicine

## 2018-04-04 MED ORDER — ALPRAZOLAM 1 MG PO TABS: 1.0000 mg | ORAL_TABLET | Freq: Every day | ORAL | 0 refills | Status: DC | PRN

## 2018-05-03 ENCOUNTER — Other Ambulatory Visit: Payer: Self-pay | Admitting: Sports Medicine

## 2018-05-04 ENCOUNTER — Other Ambulatory Visit: Payer: Self-pay | Admitting: *Deleted

## 2018-05-04 MED ORDER — ALPRAZOLAM 1 MG PO TABS: 1.0000 mg | ORAL_TABLET | Freq: Every day | ORAL | 0 refills | Status: DC | PRN

## 2018-05-26 ENCOUNTER — Other Ambulatory Visit: Payer: Self-pay | Admitting: Sports Medicine

## 2018-06-02 ENCOUNTER — Other Ambulatory Visit: Payer: Self-pay | Admitting: Sports Medicine

## 2018-06-05 ENCOUNTER — Other Ambulatory Visit: Payer: Self-pay | Admitting: Sports Medicine

## 2018-06-07 ENCOUNTER — Other Ambulatory Visit: Payer: Self-pay | Admitting: Sports Medicine

## 2018-06-07 DIAGNOSIS — E663 Overweight: Secondary | ICD-10-CM

## 2018-07-06 ENCOUNTER — Other Ambulatory Visit: Payer: Self-pay | Admitting: Sports Medicine

## 2018-07-06 MED ORDER — ALPRAZOLAM 1 MG PO TABS: 1.0000 mg | ORAL_TABLET | Freq: Every day | ORAL | 0 refills | Status: DC | PRN

## 2018-08-04 ENCOUNTER — Other Ambulatory Visit: Payer: Self-pay | Admitting: Sports Medicine

## 2018-08-04 MED ORDER — ALPRAZOLAM 1 MG PO TABS: 1.0000 mg | ORAL_TABLET | Freq: Every day | ORAL | 0 refills | Status: DC | PRN

## 2018-08-05 DIAGNOSIS — J00 Acute nasopharyngitis [common cold]: Secondary | ICD-10-CM | POA: Diagnosis not present

## 2018-08-05 DIAGNOSIS — J018 Other acute sinusitis: Secondary | ICD-10-CM | POA: Diagnosis not present

## 2018-08-15 DIAGNOSIS — N949 Unspecified condition associated with female genital organs and menstrual cycle: Secondary | ICD-10-CM | POA: Diagnosis not present

## 2018-08-15 DIAGNOSIS — B373 Candidiasis of vulva and vagina: Secondary | ICD-10-CM | POA: Diagnosis not present

## 2018-08-31 ENCOUNTER — Other Ambulatory Visit: Payer: Self-pay | Admitting: Sports Medicine

## 2018-09-02 ENCOUNTER — Other Ambulatory Visit: Payer: Self-pay | Admitting: Sports Medicine

## 2018-09-03 ENCOUNTER — Other Ambulatory Visit: Payer: Self-pay | Admitting: Sports Medicine

## 2018-09-07 DIAGNOSIS — E559 Vitamin D deficiency, unspecified: Secondary | ICD-10-CM | POA: Diagnosis not present

## 2018-09-07 DIAGNOSIS — G4709 Other insomnia: Secondary | ICD-10-CM | POA: Diagnosis not present

## 2018-09-07 DIAGNOSIS — N958 Other specified menopausal and perimenopausal disorders: Secondary | ICD-10-CM | POA: Diagnosis not present

## 2018-09-07 DIAGNOSIS — E782 Mixed hyperlipidemia: Secondary | ICD-10-CM | POA: Diagnosis not present

## 2018-09-07 DIAGNOSIS — R5383 Other fatigue: Secondary | ICD-10-CM | POA: Diagnosis not present

## 2018-09-11 DIAGNOSIS — R5383 Other fatigue: Secondary | ICD-10-CM | POA: Diagnosis not present

## 2018-09-11 DIAGNOSIS — N958 Other specified menopausal and perimenopausal disorders: Secondary | ICD-10-CM | POA: Diagnosis not present

## 2018-09-11 DIAGNOSIS — G4709 Other insomnia: Secondary | ICD-10-CM | POA: Diagnosis not present

## 2018-09-11 DIAGNOSIS — R6882 Decreased libido: Secondary | ICD-10-CM | POA: Diagnosis not present

## 2018-09-29 ENCOUNTER — Encounter: Payer: BLUE CROSS/BLUE SHIELD | Admitting: Sports Medicine

## 2018-10-03 ENCOUNTER — Ambulatory Visit (INDEPENDENT_AMBULATORY_CARE_PROVIDER_SITE_OTHER): Payer: BLUE CROSS/BLUE SHIELD

## 2018-10-03 ENCOUNTER — Ambulatory Visit (INDEPENDENT_AMBULATORY_CARE_PROVIDER_SITE_OTHER): Payer: BLUE CROSS/BLUE SHIELD | Admitting: Sports Medicine

## 2018-10-03 VITALS — BP 119/71 | HR 76 | Ht 64.0 in | Wt 153.0 lb

## 2018-10-03 DIAGNOSIS — M170 Bilateral primary osteoarthritis of knee: Secondary | ICD-10-CM

## 2018-10-03 DIAGNOSIS — E785 Hyperlipidemia, unspecified: Secondary | ICD-10-CM | POA: Diagnosis not present

## 2018-10-03 DIAGNOSIS — M19041 Primary osteoarthritis, right hand: Secondary | ICD-10-CM | POA: Diagnosis not present

## 2018-10-03 DIAGNOSIS — M19042 Primary osteoarthritis, left hand: Secondary | ICD-10-CM | POA: Diagnosis not present

## 2018-10-03 DIAGNOSIS — R635 Abnormal weight gain: Secondary | ICD-10-CM

## 2018-10-03 DIAGNOSIS — M159 Polyosteoarthritis, unspecified: Secondary | ICD-10-CM

## 2018-10-03 DIAGNOSIS — M1712 Unilateral primary osteoarthritis, left knee: Secondary | ICD-10-CM | POA: Diagnosis not present

## 2018-10-03 DIAGNOSIS — Z0000 Encounter for general adult medical examination without abnormal findings: Secondary | ICD-10-CM

## 2018-10-03 DIAGNOSIS — M1711 Unilateral primary osteoarthritis, right knee: Secondary | ICD-10-CM | POA: Diagnosis not present

## 2018-10-03 DIAGNOSIS — R011 Cardiac murmur, unspecified: Secondary | ICD-10-CM | POA: Insufficient documentation

## 2018-10-03 MED ORDER — CELECOXIB 200 MG PO CAPS: ORAL_CAPSULE | ORAL | 2 refills | Status: DC

## 2018-10-03 MED ORDER — ALPRAZOLAM 1 MG PO TABS: 1.0000 mg | ORAL_TABLET | Freq: Every day | ORAL | 0 refills | Status: DC | PRN

## 2018-10-03 MED ORDER — PHENTERMINE HCL 37.5 MG PO TABS: ORAL_TABLET | ORAL | 0 refills | Status: DC

## 2018-10-03 NOTE — Assessment & Plan Note (Signed)
Annual physical as above, adding routine labs. Last Pap smear was 5 years ago, referral to OB. Ordering mammogram.

## 2018-10-03 NOTE — Assessment & Plan Note (Signed)
Getting updated x-rays, adding Celebrex.

## 2018-10-03 NOTE — Assessment & Plan Note (Signed)
Adding Celebrex, getting updated x-rays.

## 2018-10-03 NOTE — Progress Notes (Addendum)
Subjective:    CC: Annual physical exam with complaints  HPI:  Annual physical: Due for cervical cancer screening, mammogram.  Hand pain: Bilateral, mostly at the DIPs of both hands.  Moderate, persistent without radiation.  Bilateral knee pain: Known osteoarthritis, pain is moderate, persistent, localized anteriorly and at the medial joint lines without radiation or mechanical symptoms.  Not using any NSAIDs.  Palpitations: Intermittent, no presyncope.  Weight gain: Is dating again and would like some help losing weight, she is doing weight watchers without much improvement.  I reviewed the past medical history, family history, social history, surgical history, and allergies today and no changes were needed.  Please see the problem list section below in epic for further details.  Past Medical History: Past Medical History:  Diagnosis Date  . Anxiety   . Arthritis   . Barrett's esophagus   . Hiatal hernia   . Hx of breast implants, bilateral 1990  . S/P partial hysterectomy 1980   Past Surgical History: Past Surgical History:  Procedure Laterality Date  . ABDOMINAL HYSTERECTOMY    . BREAST ENHANCEMENT SURGERY  1989  . CHOLECYSTECTOMY    . NOSE SURGERY    . sculpting     Thermal sculpting of abdominal adipose tissue  . WRIST SURGERY     Social History: Social History   Socioeconomic History  . Marital status: Widowed    Spouse name: Not on file  . Number of children: Not on file  . Years of education: Not on file  . Highest education level: Not on file  Occupational History  . Occupation: Agricultural consultant  Social Needs  . Financial resource strain: Not on file  . Food insecurity:    Worry: Not on file    Inability: Not on file  . Transportation needs:    Medical: Not on file    Non-medical: Not on file  Tobacco Use  . Smoking status: Never Smoker  . Smokeless tobacco: Never Used  Substance and Sexual Activity  . Alcohol use: Yes    Alcohol/week: 0.0 standard  drinks  . Drug use: No  . Sexual activity: Yes    Partners: Male  Lifestyle  . Physical activity:    Days per week: Not on file    Minutes per session: Not on file  . Stress: Not on file  Relationships  . Social connections:    Talks on phone: Not on file    Gets together: Not on file    Attends religious service: Not on file    Active member of club or organization: Not on file    Attends meetings of clubs or organizations: Not on file    Relationship status: Not on file  Other Topics Concern  . Not on file  Social History Narrative  . Not on file   Family History: Family History  Problem Relation Age of Onset  . Cancer Mother        melanoma  . Cancer Father        melonoma,bone  . Diabetes Mother   . Diabetes Father   . Cancer Maternal Grandmother        breast   Allergies: Allergies  Allergen Reactions  . Codeine    Medications: See med rec.  Review of Systems: No headache, visual changes, nausea, vomiting, diarrhea, constipation, dizziness, abdominal pain, skin rash, fevers, chills, night sweats, swollen lymph nodes, weight loss, chest pain, body aches, joint swelling, muscle aches, shortness of breath, mood changes, visual or  auditory hallucinations.  Objective:    General: Well Developed, well nourished, and in no acute distress.  Neuro: Alert and oriented x3, extra-ocular muscles intact, sensation grossly intact. Cranial nerves II through XII are intact, motor, sensory, and coordinative functions are all intact. HEENT: Normocephalic, atraumatic, pupils equal round reactive to light, neck supple, no masses, no lymphadenopathy, thyroid nonpalpable. Oropharynx, nasopharynx, external ear canals are unremarkable. Skin: Warm and dry, no rashes noted.  Cardiac: Regular rate and rhythm, no rubs or gallops, she did have a 1-2/6 systolic ejection murmur heard over the precordium. Respiratory: Clear to auscultation bilaterally. Not using accessory muscles, speaking in  full sentences.  Abdominal: Soft, nontender, nondistended, positive bowel sounds, no masses, no organomegaly.  Musculoskeletal: Shoulder, elbow, wrist, hip, knee, ankle stable, and with full range of motion.  Bilateral Bouchard and Heberden nodes.  Impression and Recommendations:    The patient was counselled, risk factors were discussed, anticipatory guidance given.  Annual physical exam Annual physical as above, adding routine labs. Last Pap smear was 5 years ago, referral to OB. Ordering mammogram.   Abnormal weight gain Starting phentermine, return monthly for weight checks and refills.  Generalized osteoarthritis of hand Getting updated x-rays, adding Celebrex.  Osteoarthritis of both knees Adding Celebrex, getting updated x-rays.  Systolic murmur Systolic murmur with palpitations, likely aortic stenosis. Adding an echo, as well as a long-term monitor   ___________________________________________ Ihor Austin. Benjamin Stain, M.D., ABFM., CAQSM. Primary Care and Sports Medicine [facility]  Adjunct Professor of Family Medicine  [employer]

## 2018-10-03 NOTE — Assessment & Plan Note (Signed)
Systolic murmur with palpitations, likely aortic stenosis. Adding an echo, as well as a long-term monitor

## 2018-10-03 NOTE — Assessment & Plan Note (Signed)
Starting phentermine, return monthly for weight checks and refills. 

## 2018-10-04 DIAGNOSIS — E785 Hyperlipidemia, unspecified: Secondary | ICD-10-CM | POA: Diagnosis not present

## 2018-10-05 LAB — LIPID PANEL W/REFLEX DIRECT LDL
Cholesterol: 172 mg/dL (ref <200)
HDL: 62 mg/dL (ref >=50)
LDL Cholesterol (Calc): 92 mg/dL (calc)
Non-HDL Cholesterol (Calc): 110 mg/dL (calc) (ref <130)
Total CHOL/HDL Ratio: 2.8 (calc) (ref <5.0)
Triglycerides: 87 mg/dL (ref <150)

## 2018-10-05 LAB — CBC
HCT: 39 % (ref 35.0–45.0)
Hemoglobin: 13.2 g/dL (ref 11.7–15.5)
MCH: 31.4 pg (ref 27.0–33.0)
MCHC: 33.8 g/dL (ref 32.0–36.0)
MCV: 92.9 fL (ref 80.0–100.0)
MPV: 12.6 fL — ABNORMAL HIGH (ref 7.5–12.5)
Platelets: 228 10*3/uL (ref 140–400)
RBC: 4.2 10*6/uL (ref 3.80–5.10)
RDW: 13.1 % (ref 11.0–15.0)
WBC: 4.2 10*3/uL (ref 3.8–10.8)

## 2018-10-05 LAB — COMPREHENSIVE METABOLIC PANEL WITH GFR
ALT: 15 U/L (ref 6–29)
BUN: 10 mg/dL (ref 7–25)
Total Bilirubin: 0.4 mg/dL (ref 0.2–1.2)

## 2018-10-05 LAB — COMPREHENSIVE METABOLIC PANEL
AG Ratio: 1.8 (calc) (ref 1.0–2.5)
AST: 21 U/L (ref 10–35)
Albumin: 4.2 g/dL (ref 3.6–5.1)
Alkaline phosphatase (APISO): 37 U/L (ref 37–153)
CO2: 26 mmol/L (ref 20–32)
Calcium: 9.2 mg/dL (ref 8.6–10.4)
Chloride: 106 mmol/L (ref 98–110)
Creat: 0.78 mg/dL (ref 0.50–0.99)
Globulin: 2.4 g/dL (calc) (ref 1.9–3.7)
Glucose, Bld: 79 mg/dL (ref 65–99)
Potassium: 4.3 mmol/L (ref 3.5–5.3)
Sodium: 138 mmol/L (ref 135–146)
Total Protein: 6.6 g/dL (ref 6.1–8.1)

## 2018-10-05 LAB — TSH: TSH: 1.39 mIU/L (ref 0.40–4.50)

## 2018-10-09 ENCOUNTER — Encounter: Payer: Self-pay | Admitting: Sports Medicine

## 2018-10-11 ENCOUNTER — Telehealth: Payer: Self-pay | Admitting: *Deleted

## 2018-10-11 NOTE — Telephone Encounter (Signed)
Called patient to cancel her echocardiogram for tomorrow, 10/12/2018, due to COVID-19. Patient reports some frequent chest pressure recently but thinks it is due to high volumes of stress in her life. After speaking with Dr. Dulce Sellar regarding symptoms, patient was advised to keep echocardiogram appointment. Patient decided to reschedule testing for 12/11/2018 at 8:15 am at the [facility] instead. Advised patient to contact our office if symptoms worsen or if she has any further concerns. Patient verbalized understanding. No further questions.

## 2018-10-12 ENCOUNTER — Ambulatory Visit (HOSPITAL_BASED_OUTPATIENT_CLINIC_OR_DEPARTMENT_OTHER): Payer: BLUE CROSS/BLUE SHIELD

## 2018-10-13 ENCOUNTER — Other Ambulatory Visit (HOSPITAL_BASED_OUTPATIENT_CLINIC_OR_DEPARTMENT_OTHER): Payer: BLUE CROSS/BLUE SHIELD

## 2018-10-17 ENCOUNTER — Other Ambulatory Visit: Payer: Self-pay | Admitting: Sports Medicine

## 2018-10-17 DIAGNOSIS — R011 Cardiac murmur, unspecified: Secondary | ICD-10-CM

## 2018-10-17 DIAGNOSIS — R002 Palpitations: Secondary | ICD-10-CM

## 2018-10-19 ENCOUNTER — Encounter: Payer: Self-pay | Admitting: Sports Medicine

## 2018-10-19 ENCOUNTER — Telehealth: Payer: Self-pay | Admitting: Radiology

## 2018-10-19 NOTE — Telephone Encounter (Signed)
[employer] Long Term monitor was enrolled to be mailed to patient on 3-26. Patient knows monitor should arrive in 5-7 days

## 2018-10-24 ENCOUNTER — Ambulatory Visit (INDEPENDENT_AMBULATORY_CARE_PROVIDER_SITE_OTHER): Payer: BLUE CROSS/BLUE SHIELD

## 2018-10-24 DIAGNOSIS — R002 Palpitations: Secondary | ICD-10-CM | POA: Diagnosis not present

## 2018-10-24 DIAGNOSIS — R011 Cardiac murmur, unspecified: Secondary | ICD-10-CM | POA: Diagnosis not present

## 2018-10-25 ENCOUNTER — Encounter (INDEPENDENT_AMBULATORY_CARE_PROVIDER_SITE_OTHER): Payer: BLUE CROSS/BLUE SHIELD | Admitting: Sports Medicine

## 2018-10-25 DIAGNOSIS — R079 Chest pain, unspecified: Secondary | ICD-10-CM

## 2018-10-27 ENCOUNTER — Telehealth: Payer: Self-pay | Admitting: *Deleted

## 2018-10-27 NOTE — Telephone Encounter (Signed)
Pt called about her Zio monitor. She put it on and then had to move it and so it is not sticking very well. Called company and they weren't very helpful. She has monitor on and says the green light came on and the orange light is not on. I let her know that as long as the orange light does not come on she is getting good contact. She is to wear for 14 days so not sure how long this will stay on, let her know to call back if further problems.

## 2018-10-31 ENCOUNTER — Ambulatory Visit: Payer: BLUE CROSS/BLUE SHIELD | Admitting: Sports Medicine

## 2018-10-31 ENCOUNTER — Encounter: Payer: Self-pay | Admitting: Sports Medicine

## 2018-10-31 ENCOUNTER — Other Ambulatory Visit: Payer: Self-pay

## 2018-10-31 DIAGNOSIS — F419 Anxiety disorder, unspecified: Secondary | ICD-10-CM

## 2018-10-31 DIAGNOSIS — F32A Depression, unspecified: Secondary | ICD-10-CM

## 2018-10-31 DIAGNOSIS — M170 Bilateral primary osteoarthritis of knee: Secondary | ICD-10-CM

## 2018-10-31 DIAGNOSIS — M159 Polyosteoarthritis, unspecified: Secondary | ICD-10-CM

## 2018-10-31 DIAGNOSIS — R635 Abnormal weight gain: Secondary | ICD-10-CM

## 2018-10-31 DIAGNOSIS — F329 Major depressive disorder, single episode, unspecified: Secondary | ICD-10-CM

## 2018-10-31 MED ORDER — ALPRAZOLAM 1 MG PO TABS: 1.0000 mg | ORAL_TABLET | Freq: Every day | ORAL | 3 refills | Status: DC | PRN

## 2018-10-31 MED ORDER — PHENTERMINE HCL 37.5 MG PO TABS: ORAL_TABLET | ORAL | 0 refills | Status: DC

## 2018-10-31 NOTE — Progress Notes (Signed)
Virtual Visit via WebEx/MyChart   I connected with  Ladena Stopka  on 10/31/18 via WebEx/MyChart and verified that I am speaking with the correct person using two identifiers.   I discussed the limitations, risks, security and privacy concerns of performing an evaluation and management service by WebEx/MyChart , including the higher likelihood of inaccurate diagnosis and treatment, and the availability of in person appointments.  We also discussed the likely need of an additional face to face encounter for complete and high quality delivery of care.  I also discussed with the patient that there may be a patient responsible charge related to this service. The patient expressed understanding and wishes to proceed.  Subjective:    CC: Weight check  HPI: Abnormal weight gain: 10 pound weight loss after the first month.  Anxiety: Needs a refill on alprazolam.  Widespread osteoarthritis: For the most part resolved with Celebrex.  I reviewed the past medical history, family history, social history, surgical history, and allergies today and no changes were needed.  Please see the problem list section below in epic for further details.  Past Medical History: Past Medical History:  Diagnosis Date  . Anxiety   . Arthritis   . Barrett's esophagus   . Hiatal hernia   . Hx of breast implants, bilateral 1990  . S/P partial hysterectomy 1980   Past Surgical History: Past Surgical History:  Procedure Laterality Date  . ABDOMINAL HYSTERECTOMY    . BREAST ENHANCEMENT SURGERY  1989  . CHOLECYSTECTOMY    . NOSE SURGERY    . sculpting     Thermal sculpting of abdominal adipose tissue  . WRIST SURGERY     Social History: Social History   Socioeconomic History  . Marital status: Widowed    Spouse name: Not on file  . Number of children: Not on file  . Years of education: Not on file  . Highest education level: Not on file  Occupational History  . Occupation: Agricultural consultant  Social Needs  .  Financial resource strain: Not on file  . Food insecurity:    Worry: Not on file    Inability: Not on file  . Transportation needs:    Medical: Not on file    Non-medical: Not on file  Tobacco Use  . Smoking status: Never Smoker  . Smokeless tobacco: Never Used  Substance and Sexual Activity  . Alcohol use: Yes    Alcohol/week: 0.0 standard drinks  . Drug use: No  . Sexual activity: Yes    Partners: Male  Lifestyle  . Physical activity:    Days per week: Not on file    Minutes per session: Not on file  . Stress: Not on file  Relationships  . Social connections:    Talks on phone: Not on file    Gets together: Not on file    Attends religious service: Not on file    Active member of club or organization: Not on file    Attends meetings of clubs or organizations: Not on file    Relationship status: Not on file  Other Topics Concern  . Not on file  Social History Narrative  . Not on file   Family History: Family History  Problem Relation Age of Onset  . Cancer Mother        melanoma  . Cancer Father        melonoma,bone  . Diabetes Mother   . Diabetes Father   . Cancer Maternal Grandmother  breast   Allergies: Allergies  Allergen Reactions  . Codeine    Medications: See med rec.  Review of Systems: No fevers, chills, night sweats, weight loss, chest pain, or shortness of breath.   Objective:    General: Speaking full sentences, no audible heavy breathing.  Sounds alert and appropriately interactive.  Appears well.  Face symmetric.  Extraocular movements intact.  Pupils equal and round.  No nasal flaring or accessory muscle use visualized.  No other physical exam performed due to the non-physical nature of this visit.  Impression and Recommendations:    Abnormal weight gain 10 pound weight loss, refilling phentermine, she desires 1 more month. She can set up another WebEx visit in a month. No increase in palpitations with phentermine.  Anxiety and  depression Needs a refill on alprazolam. I am going to put 3 refills.  Osteoarthritis of both knees Better with Celebrex.  Generalized osteoarthritis of hand Essentially resolved with Celebrex.  I discussed the above assessment and treatment plan with the patient. The patient was provided an opportunity to ask questions and all were answered. The patient agreed with the plan and demonstrated an understanding of the instructions.   The patient was advised to call back or seek an in-person evaluation if the symptoms worsen or if the condition fails to improve as anticipated.   I provided 21 minutes of electronic video evaluation time during this encounter, less than 50% was time needed to gather information, review chart and records, explain the treatment plan to the patient, and complete documentation.   ___________________________________________ Ihor Austinhomas J. Benjamin Stainhekkekandam, M.D., ABFM., CAQSM. Primary Care and Sports Medicine [facility]  Adjunct Professor of Family Medicine  [employer]

## 2018-10-31 NOTE — Assessment & Plan Note (Signed)
Better with Celebrex.

## 2018-10-31 NOTE — Assessment & Plan Note (Signed)
Essentially resolved with Celebrex.

## 2018-10-31 NOTE — Assessment & Plan Note (Signed)
Needs a refill on alprazolam. I am going to put 3 refills.

## 2018-10-31 NOTE — Assessment & Plan Note (Signed)
10 pound weight loss, refilling phentermine, she desires 1 more month. She can set up another WebEx visit in a month. No increase in palpitations with phentermine.

## 2018-11-09 DIAGNOSIS — R002 Palpitations: Secondary | ICD-10-CM | POA: Diagnosis not present

## 2018-11-10 ENCOUNTER — Other Ambulatory Visit: Payer: Self-pay

## 2018-12-11 ENCOUNTER — Ambulatory Visit (HOSPITAL_BASED_OUTPATIENT_CLINIC_OR_DEPARTMENT_OTHER): Payer: BLUE CROSS/BLUE SHIELD

## 2018-12-11 ENCOUNTER — Other Ambulatory Visit: Payer: Self-pay | Admitting: Sports Medicine

## 2018-12-11 DIAGNOSIS — E663 Overweight: Secondary | ICD-10-CM

## 2018-12-12 MED ORDER — TOPIRAMATE 100 MG PO TABS: 100.0000 mg | ORAL_TABLET | Freq: Every day | ORAL | 0 refills | Status: DC

## 2018-12-13 ENCOUNTER — Ambulatory Visit (HOSPITAL_BASED_OUTPATIENT_CLINIC_OR_DEPARTMENT_OTHER)
Admission: RE | Admit: 2018-12-13 | Discharge: 2018-12-13 | Disposition: A | Discharge status: Home / Self Care | Payer: BLUE CROSS/BLUE SHIELD | Source: Ambulatory Visit | Attending: Sports Medicine | Admitting: Sports Medicine

## 2018-12-13 ENCOUNTER — Other Ambulatory Visit: Payer: Self-pay

## 2018-12-13 DIAGNOSIS — R011 Cardiac murmur, unspecified: Secondary | ICD-10-CM

## 2018-12-13 NOTE — Progress Notes (Signed)
  Echocardiogram 2D Echocardiogram has been performed.  Sinda Du 12/13/2018, 3:14 PM

## 2018-12-14 ENCOUNTER — Encounter: Payer: Self-pay | Admitting: Sports Medicine

## 2018-12-14 ENCOUNTER — Telehealth (INDEPENDENT_AMBULATORY_CARE_PROVIDER_SITE_OTHER): Payer: BLUE CROSS/BLUE SHIELD | Admitting: Sports Medicine

## 2018-12-14 DIAGNOSIS — R635 Abnormal weight gain: Secondary | ICD-10-CM | POA: Diagnosis not present

## 2018-12-14 DIAGNOSIS — R011 Cardiac murmur, unspecified: Secondary | ICD-10-CM

## 2018-12-14 MED ORDER — PHENTERMINE HCL 37.5 MG PO TABS: ORAL_TABLET | ORAL | 0 refills | Status: DC

## 2018-12-14 NOTE — Assessment & Plan Note (Signed)
No further evaluation needed 

## 2018-12-14 NOTE — Progress Notes (Signed)
Virtual Visit via WebEx/MyChart   I connected with  Katherine Lawson  on 12/14/18 via WebEx/MyChart/Doximity Video and verified that I am speaking with the correct person using two identifiers.   I discussed the limitations, risks, security and privacy concerns of performing an evaluation and management service by WebEx/MyChart/Doximity Video, including the higher likelihood of inaccurate diagnosis and treatment, and the availability of in person appointments.  We also discussed the likely need of an additional face to face encounter for complete and high quality delivery of care.  I also discussed with the patient that there may be a patient responsible charge related to this service. The patient expressed understanding and wishes to proceed.  Provider location is either at home or medical facility. Patient location is at their home, different from provider location. People involved in care of the patient during this telehealth encounter were myself, my nurse/medical assistant, and my front office/scheduling team member.  Subjective:    CC: Follow-up  HPI: Abnormal weight gain: Stable over the last month on phentermine.  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  No further evaluation needed.  I reviewed the past medical history, family history, social history, surgical history, and allergies today and no changes were needed.  Please see the problem list section below in epic for further details.  Past Medical History: Past Medical History:  Diagnosis Date  . Anxiety   . Arthritis   . Barrett's esophagus   . Hiatal hernia   . Hx of breast implants, bilateral 1990  . S/P partial hysterectomy 1980   Past Surgical History: Past Surgical History:  Procedure Laterality Date  . ABDOMINAL HYSTERECTOMY    . BREAST ENHANCEMENT SURGERY  1989  . CHOLECYSTECTOMY    . NOSE SURGERY    .  sculpting     Thermal sculpting of abdominal adipose tissue  . WRIST SURGERY     Social History: Social History   Socioeconomic History  . Marital status: Widowed    Spouse name: Not on file  . Number of children: Not on file  . Years of education: Not on file  . Highest education level: Not on file  Occupational History  . Occupation: Agricultural consultantvolunteer  Social Needs  . Financial resource strain: Not on file  . Food insecurity:    Worry: Not on file    Inability: Not on file  . Transportation needs:    Medical: Not on file    Non-medical: Not on file  Tobacco Use  . Smoking status: Never Smoker  . Smokeless tobacco: Never Used  Substance and Sexual Activity  . Alcohol use: Yes    Alcohol/week: 0.0 standard drinks  . Drug use: No  . Sexual activity: Yes    Partners: Male  Lifestyle  . Physical activity:    Days per week: Not on file    Minutes per session: Not on file  . Stress: Not on file  Relationships  . Social connections:    Talks on phone: Not on file    Gets together: Not on file    Attends religious service: Not on file    Active member of club or organization: Not on file    Attends meetings of clubs or organizations: Not on file    Relationship status: Not on file  Other Topics Concern  . Not on file  Social History Narrative  . Not on file   Family  History: Family History  Problem Relation Age of Onset  . Cancer Mother        melanoma  . Cancer Father        melonoma,bone  . Diabetes Mother   . Diabetes Father   . Cancer Maternal Grandmother        breast   Allergies: Allergies  Allergen Reactions  . Codeine    Medications: See med rec.  Review of Systems: No fevers, chills, night sweats, weight loss, chest pain, or shortness of breath.   Objective:    General: Speaking full sentences, no audible heavy breathing.  Sounds alert and appropriately interactive.  Appears well.  Face symmetric.  Extraocular movements intact.  Pupils equal and  round.  No nasal flaring or accessory muscle use visualized.  No other physical exam performed due to the non-physical nature of this visit.  Impression and Recommendations:    Abnormal weight gain Weight is stabilized after the first month on phentermine. Entering the second month, she does need to work a little more aggressively on cutting out carbohydrates. We will do another Doximity visit in 1 month.  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  I discussed the above assessment and treatment plan with the patient. The patient was provided an opportunity to ask questions and all were answered. The patient agreed with the plan and demonstrated an understanding of the instructions.   The patient was advised to call back or seek an in-person evaluation if the symptoms worsen or if the condition fails to improve as anticipated.   I provided 25 minutes of non-face-to-face time during this encounter, 15 minutes of additional time was needed to gather information, review chart, records, communicate/coordinate with staff remotely, troubleshooting the multiple errors that we get every time when trying to do video calls through the electronic medical record, WebEx, and Doximity, restart the encounter multiple times due to instability of the software, as well as complete documentation.   ___________________________________________ Ihor Austin. Benjamin Stain, M.D., ABFM., CAQSM. Primary Care and Sports Medicine [facility]  Adjunct Professor of Family Medicine  [employer]

## 2018-12-14 NOTE — Assessment & Plan Note (Signed)
Weight is stabilized after the first month on phentermine. Entering the second month, she does need to work a little more aggressively on cutting out carbohydrates. We will do another Doximity visit in 1 month.

## 2019-01-04 DIAGNOSIS — K130 Diseases of lips: Secondary | ICD-10-CM | POA: Diagnosis not present

## 2019-01-07 ENCOUNTER — Other Ambulatory Visit: Payer: Self-pay | Admitting: Sports Medicine

## 2019-01-08 ENCOUNTER — Other Ambulatory Visit: Payer: Self-pay | Admitting: Sports Medicine

## 2019-01-08 MED ORDER — DEXILANT 60 MG PO CPDR: 60.0000 mg | DELAYED_RELEASE_CAPSULE | Freq: Every day | ORAL | 1 refills | Status: DC

## 2019-01-17 DIAGNOSIS — Z1231 Encounter for screening mammogram for malignant neoplasm of breast: Secondary | ICD-10-CM | POA: Diagnosis not present

## 2019-01-17 LAB — HM MAMMOGRAPHY

## 2019-01-25 ENCOUNTER — Ambulatory Visit: Payer: BLUE CROSS/BLUE SHIELD | Admitting: Sports Medicine

## 2019-01-25 ENCOUNTER — Encounter: Payer: Self-pay | Admitting: Sports Medicine

## 2019-01-25 DIAGNOSIS — M722 Plantar fascial fibromatosis: Secondary | ICD-10-CM | POA: Insufficient documentation

## 2019-01-25 NOTE — Assessment & Plan Note (Signed)
This patient has high arches, I have recommended supportive footwear and avoiding barefoot walking, the fibroma is nonpainful, so no aggressive treatment is needed at this time.

## 2019-01-25 NOTE — Progress Notes (Signed)
Subjective:    CC: Nodule on left foot  HPI: For the past few months this pleasant 63 year old female has noted a painless nodule on the plantar aspect of her left foot, and midway through the arch.  Symptoms are moderate, localized without radiation, no discomfort.  She does not tend to wear footwear around the house, and wears unsupportive shoes through the day.  I reviewed the past medical history, family history, social history, surgical history, and allergies today and no changes were needed.  Please see the problem list section below in epic for further details.  Past Medical History: Past Medical History:  Diagnosis Date  . Anxiety   . Arthritis   . Barrett's esophagus   . Hiatal hernia   . Hx of breast implants, bilateral 1990  . S/P partial hysterectomy 1980   Past Surgical History: Past Surgical History:  Procedure Laterality Date  . ABDOMINAL HYSTERECTOMY    . BREAST ENHANCEMENT SURGERY  1989  . CHOLECYSTECTOMY    . NOSE SURGERY    . sculpting     Thermal sculpting of abdominal adipose tissue  . WRIST SURGERY     Social History: Social History   Socioeconomic History  . Marital status: Widowed    Spouse name: Not on file  . Number of children: Not on file  . Years of education: Not on file  . Highest education level: Not on file  Occupational History  . Occupation: Agricultural consultantvolunteer  Social Needs  . Financial resource strain: Not on file  . Food insecurity    Worry: Not on file    Inability: Not on file  . Transportation needs    Medical: Not on file    Non-medical: Not on file  Tobacco Use  . Smoking status: Never Smoker  . Smokeless tobacco: Never Used  Substance and Sexual Activity  . Alcohol use: Yes    Alcohol/week: 0.0 standard drinks  . Drug use: No  . Sexual activity: Yes    Partners: Male  Lifestyle  . Physical activity    Days per week: Not on file    Minutes per session: Not on file  . Stress: Not on file  Relationships  . Social  Musicianconnections    Talks on phone: Not on file    Gets together: Not on file    Attends religious service: Not on file    Active member of club or organization: Not on file    Attends meetings of clubs or organizations: Not on file    Relationship status: Not on file  Other Topics Concern  . Not on file  Social History Narrative  . Not on file   Family History: Family History  Problem Relation Age of Onset  . Cancer Mother        melanoma  . Cancer Father        melonoma,bone  . Diabetes Mother   . Diabetes Father   . Cancer Maternal Grandmother        breast   Allergies: Allergies  Allergen Reactions  . Codeine    Medications: See med rec.  Review of Systems: No fevers, chills, night sweats, weight loss, chest pain, or shortness of breath.   Objective:    General: Well Developed, well nourished, and in no acute distress.  Neuro: Alert and oriented x3, extra-ocular muscles intact, sensation grossly intact.  HEENT: Normocephalic, atraumatic, pupils equal round reactive to light, neck supple, no masses, no lymphadenopathy, thyroid nonpalpable.  Skin: Warm and  dry, no rashes. Cardiac: Regular rate and rhythm, no murmurs rubs or gallops, no lower extremity edema.  Respiratory: Clear to auscultation bilaterally. Not using accessory muscles, speaking in full sentences. Left foot: No visible erythema or swelling. Range of motion is full in all directions. Strength is 5/5 in all directions. No hallux valgus. Pes cavus No abnormal callus noted. No pain over the navicular prominence, or base of fifth metatarsal. No tenderness to palpation of the calcaneal insertion of plantar fascia. She does have a single plantar fascial fibroma in the mid arch No pain at the Achilles insertion. No pain over the calcaneal bursa. No pain of the retrocalcaneal bursa. No tenderness to palpation over the tarsals, metatarsals, or phalanges. No hallux rigidus or limitus. No tenderness palpation  over interphalangeal joints. No pain with compression of the metatarsal heads. Neurovascularly intact distally.  Impression and Recommendations:    Plantar fascial fibromatosis of left foot This patient has high arches, I have recommended supportive footwear and avoiding barefoot walking, the fibroma is nonpainful, so no aggressive treatment is needed at this time.   ___________________________________________ Gwen Her. Dianah Field, M.D., ABFM., CAQSM. Primary Care and Sports Medicine [facility]  Adjunct Professor of [employer]

## 2019-02-14 DIAGNOSIS — Z20828 Contact with and (suspected) exposure to other viral communicable diseases: Secondary | ICD-10-CM | POA: Diagnosis not present

## 2019-02-14 DIAGNOSIS — Z03818 Encounter for observation for suspected exposure to other biological agents ruled out: Secondary | ICD-10-CM | POA: Diagnosis not present

## 2019-02-20 ENCOUNTER — Other Ambulatory Visit: Payer: Self-pay

## 2019-02-20 DIAGNOSIS — F32A Depression, unspecified: Secondary | ICD-10-CM

## 2019-02-20 DIAGNOSIS — F329 Major depressive disorder, single episode, unspecified: Secondary | ICD-10-CM

## 2019-02-20 MED ORDER — ALPRAZOLAM 1 MG PO TABS: 1.0000 mg | ORAL_TABLET | Freq: Two times a day (BID) | ORAL | 0 refills | Status: DC | PRN
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
## 2019-03-23 ENCOUNTER — Other Ambulatory Visit: Payer: Self-pay | Admitting: *Deleted

## 2019-03-23 DIAGNOSIS — F419 Anxiety disorder, unspecified: Secondary | ICD-10-CM

## 2019-03-23 DIAGNOSIS — F32A Depression, unspecified: Secondary | ICD-10-CM

## 2019-03-23 DIAGNOSIS — F329 Major depressive disorder, single episode, unspecified: Secondary | ICD-10-CM

## 2019-03-23 MED ORDER — ALPRAZOLAM 1 MG PO TABS: 1.0000 mg | ORAL_TABLET | Freq: Two times a day (BID) | ORAL | 0 refills | Status: DC | PRN

## 2019-03-26 DIAGNOSIS — R5383 Other fatigue: Secondary | ICD-10-CM | POA: Diagnosis not present

## 2019-03-26 DIAGNOSIS — N958 Other specified menopausal and perimenopausal disorders: Secondary | ICD-10-CM | POA: Diagnosis not present

## 2019-03-28 DIAGNOSIS — R5383 Other fatigue: Secondary | ICD-10-CM | POA: Diagnosis not present

## 2019-03-28 DIAGNOSIS — R7989 Other specified abnormal findings of blood chemistry: Secondary | ICD-10-CM | POA: Diagnosis not present

## 2019-03-28 DIAGNOSIS — G4709 Other insomnia: Secondary | ICD-10-CM | POA: Diagnosis not present

## 2019-03-28 DIAGNOSIS — N958 Other specified menopausal and perimenopausal disorders: Secondary | ICD-10-CM | POA: Diagnosis not present

## 2019-04-23 DIAGNOSIS — Z20828 Contact with and (suspected) exposure to other viral communicable diseases: Secondary | ICD-10-CM | POA: Diagnosis not present

## 2019-04-25 ENCOUNTER — Other Ambulatory Visit: Payer: Self-pay | Admitting: *Deleted

## 2019-04-25 DIAGNOSIS — F329 Major depressive disorder, single episode, unspecified: Secondary | ICD-10-CM

## 2019-04-25 DIAGNOSIS — F32A Depression, unspecified: Secondary | ICD-10-CM

## 2019-04-25 MED ORDER — ALPRAZOLAM 1 MG PO TABS: 1.0000 mg | ORAL_TABLET | Freq: Two times a day (BID) | ORAL | 0 refills | Status: DC | PRN

## 2019-05-10 ENCOUNTER — Other Ambulatory Visit: Payer: Self-pay | Admitting: Sports Medicine

## 2019-05-10 DIAGNOSIS — E663 Overweight: Secondary | ICD-10-CM

## 2019-05-10 DIAGNOSIS — R635 Abnormal weight gain: Secondary | ICD-10-CM

## 2019-05-10 DIAGNOSIS — Z780 Asymptomatic menopausal state: Secondary | ICD-10-CM

## 2019-05-10 NOTE — Telephone Encounter (Signed)
Patient notes: " Please send to [facility] in [place] not on [address]."  RX pended. Please send if appropriate

## 2019-05-11 MED ORDER — TOPIRAMATE 100 MG PO TABS: 100.0000 mg | ORAL_TABLET | Freq: Every day | ORAL | 0 refills | Status: DC

## 2019-05-11 MED ORDER — PREMARIN 0.625 MG/GM VA CREA: TOPICAL_CREAM | Freq: Every day | VAGINAL | 0 refills | Status: DC

## 2019-05-25 ENCOUNTER — Encounter: Payer: Self-pay | Admitting: Sports Medicine

## 2019-05-25 ENCOUNTER — Other Ambulatory Visit: Payer: Self-pay | Admitting: Sports Medicine

## 2019-05-25 DIAGNOSIS — F32A Depression, unspecified: Secondary | ICD-10-CM

## 2019-05-25 DIAGNOSIS — F419 Anxiety disorder, unspecified: Secondary | ICD-10-CM

## 2019-05-25 DIAGNOSIS — F329 Major depressive disorder, single episode, unspecified: Secondary | ICD-10-CM

## 2019-05-25 MED ORDER — DEXILANT 60 MG PO CPDR: 60.0000 mg | DELAYED_RELEASE_CAPSULE | Freq: Every day | ORAL | 3 refills | Status: DC

## 2019-05-25 MED ORDER — ALPRAZOLAM 1 MG PO TABS: 1.0000 mg | ORAL_TABLET | Freq: Two times a day (BID) | ORAL | 0 refills | Status: DC | PRN

## 2019-06-02 DIAGNOSIS — J00 Acute nasopharyngitis [common cold]: Secondary | ICD-10-CM | POA: Diagnosis not present

## 2019-06-02 DIAGNOSIS — R05 Cough: Secondary | ICD-10-CM | POA: Diagnosis not present

## 2019-06-08 DIAGNOSIS — Z20828 Contact with and (suspected) exposure to other viral communicable diseases: Secondary | ICD-10-CM | POA: Diagnosis not present

## 2019-06-15 DIAGNOSIS — Z20828 Contact with and (suspected) exposure to other viral communicable diseases: Secondary | ICD-10-CM | POA: Diagnosis not present

## 2019-06-25 ENCOUNTER — Other Ambulatory Visit: Payer: Self-pay | Admitting: Sports Medicine

## 2019-06-25 DIAGNOSIS — F32A Depression, unspecified: Secondary | ICD-10-CM

## 2019-06-25 DIAGNOSIS — F329 Major depressive disorder, single episode, unspecified: Secondary | ICD-10-CM

## 2019-06-25 DIAGNOSIS — Z03818 Encounter for observation for suspected exposure to other biological agents ruled out: Secondary | ICD-10-CM | POA: Diagnosis not present

## 2019-06-25 MED ORDER — ALPRAZOLAM 1 MG PO TABS: 1.0000 mg | ORAL_TABLET | Freq: Two times a day (BID) | ORAL | 0 refills | Status: DC | PRN

## 2019-07-05 DIAGNOSIS — Z20828 Contact with and (suspected) exposure to other viral communicable diseases: Secondary | ICD-10-CM | POA: Diagnosis not present

## 2019-07-06 DIAGNOSIS — H6983 Other specified disorders of Eustachian tube, bilateral: Secondary | ICD-10-CM | POA: Diagnosis not present

## 2019-07-06 DIAGNOSIS — J988 Other specified respiratory disorders: Secondary | ICD-10-CM | POA: Diagnosis not present

## 2019-07-25 ENCOUNTER — Other Ambulatory Visit: Payer: Self-pay

## 2019-07-25 DIAGNOSIS — F329 Major depressive disorder, single episode, unspecified: Secondary | ICD-10-CM

## 2019-07-25 DIAGNOSIS — F419 Anxiety disorder, unspecified: Secondary | ICD-10-CM

## 2019-07-25 DIAGNOSIS — F32A Depression, unspecified: Secondary | ICD-10-CM

## 2019-07-26 MED ORDER — ALPRAZOLAM 1 MG PO TABS: 1.0000 mg | ORAL_TABLET | Freq: Two times a day (BID) | ORAL | 0 refills | Status: DC | PRN

## 2019-08-06 DIAGNOSIS — Z634 Disappearance and death of family member: Secondary | ICD-10-CM | POA: Diagnosis not present

## 2019-08-06 DIAGNOSIS — Z86010 Personal history of colonic polyps: Secondary | ICD-10-CM | POA: Diagnosis not present

## 2019-08-06 DIAGNOSIS — K219 Gastro-esophageal reflux disease without esophagitis: Secondary | ICD-10-CM | POA: Diagnosis not present

## 2019-08-06 DIAGNOSIS — Z6379 Other stressful life events affecting family and household: Secondary | ICD-10-CM | POA: Diagnosis not present

## 2019-08-06 DIAGNOSIS — R110 Nausea: Secondary | ICD-10-CM | POA: Diagnosis not present

## 2019-08-09 ENCOUNTER — Other Ambulatory Visit: Payer: Self-pay | Admitting: Sports Medicine

## 2019-08-09 DIAGNOSIS — E663 Overweight: Secondary | ICD-10-CM

## 2019-08-09 MED ORDER — TOPIRAMATE 100 MG PO TABS: 100.0000 mg | ORAL_TABLET | Freq: Every day | ORAL | 0 refills | Status: DC

## 2019-08-17 DIAGNOSIS — D123 Benign neoplasm of transverse colon: Secondary | ICD-10-CM | POA: Diagnosis not present

## 2019-08-17 DIAGNOSIS — Z86010 Personal history of colonic polyps: Secondary | ICD-10-CM | POA: Diagnosis not present

## 2019-08-17 DIAGNOSIS — K2270 Barrett's esophagus without dysplasia: Secondary | ICD-10-CM | POA: Diagnosis not present

## 2019-08-17 DIAGNOSIS — R1310 Dysphagia, unspecified: Secondary | ICD-10-CM | POA: Diagnosis not present

## 2019-08-17 LAB — HM COLONOSCOPY

## 2019-08-27 ENCOUNTER — Other Ambulatory Visit: Payer: Self-pay | Admitting: *Deleted

## 2019-08-27 DIAGNOSIS — F419 Anxiety disorder, unspecified: Secondary | ICD-10-CM

## 2019-08-27 DIAGNOSIS — F329 Major depressive disorder, single episode, unspecified: Secondary | ICD-10-CM

## 2019-08-27 DIAGNOSIS — F32A Depression, unspecified: Secondary | ICD-10-CM

## 2019-08-27 MED ORDER — ALPRAZOLAM 1 MG PO TABS: 1.0000 mg | ORAL_TABLET | Freq: Two times a day (BID) | ORAL | 0 refills | Status: DC | PRN

## 2019-08-27 MED ORDER — DEXILANT 60 MG PO CPDR: 60.0000 mg | DELAYED_RELEASE_CAPSULE | Freq: Every day | ORAL | 3 refills | Status: DC

## 2019-09-20 DIAGNOSIS — K5909 Other constipation: Secondary | ICD-10-CM | POA: Diagnosis not present

## 2019-09-20 DIAGNOSIS — R5383 Other fatigue: Secondary | ICD-10-CM | POA: Diagnosis not present

## 2019-09-20 DIAGNOSIS — K219 Gastro-esophageal reflux disease without esophagitis: Secondary | ICD-10-CM | POA: Diagnosis not present

## 2019-09-20 DIAGNOSIS — R195 Other fecal abnormalities: Secondary | ICD-10-CM | POA: Diagnosis not present

## 2019-09-20 DIAGNOSIS — N958 Other specified menopausal and perimenopausal disorders: Secondary | ICD-10-CM | POA: Diagnosis not present

## 2019-09-24 ENCOUNTER — Other Ambulatory Visit: Payer: Self-pay | Admitting: *Deleted

## 2019-09-24 DIAGNOSIS — F339 Major depressive disorder, recurrent, unspecified: Secondary | ICD-10-CM | POA: Diagnosis not present

## 2019-09-24 DIAGNOSIS — F32A Depression, unspecified: Secondary | ICD-10-CM

## 2019-09-24 DIAGNOSIS — F329 Major depressive disorder, single episode, unspecified: Secondary | ICD-10-CM

## 2019-09-24 MED ORDER — ALPRAZOLAM 1 MG PO TABS: 1.0000 mg | ORAL_TABLET | Freq: Two times a day (BID) | ORAL | 0 refills | Status: DC | PRN

## 2019-09-26 ENCOUNTER — Telehealth: Payer: Self-pay | Admitting: *Deleted

## 2019-09-26 DIAGNOSIS — Z113 Encounter for screening for infections with a predominantly sexual mode of transmission: Secondary | ICD-10-CM

## 2019-09-26 DIAGNOSIS — G4709 Other insomnia: Secondary | ICD-10-CM | POA: Diagnosis not present

## 2019-09-26 DIAGNOSIS — N898 Other specified noninflammatory disorders of vagina: Secondary | ICD-10-CM | POA: Diagnosis not present

## 2019-09-26 DIAGNOSIS — N951 Menopausal and female climacteric states: Secondary | ICD-10-CM | POA: Diagnosis not present

## 2019-09-26 NOTE — Assessment & Plan Note (Signed)
Ordering routine STD screening.

## 2019-09-26 NOTE — Telephone Encounter (Signed)
Testing ordered, she can get them done at the lab at her leisure.

## 2019-09-26 NOTE — Telephone Encounter (Signed)
Pt left vm wanting to know if you could order std testing for her.  She just recently found out that her significant other of 2 yrs cheated on her.

## 2019-09-27 NOTE — Telephone Encounter (Signed)
Pt.notified

## 2019-09-28 DIAGNOSIS — Z113 Encounter for screening for infections with a predominantly sexual mode of transmission: Secondary | ICD-10-CM | POA: Diagnosis not present

## 2019-10-01 LAB — C. TRACHOMATIS/N. GONORRHOEAE RNA
C. trachomatis RNA, TMA: NOT DETECTED
N. gonorrhoeae RNA, TMA: NOT DETECTED

## 2019-10-01 LAB — HEPATITIS PANEL, ACUTE
Hep A IgM: NONREACTIVE
Hep B C IgM: NONREACTIVE
Hepatitis B Surface Ag: NONREACTIVE
Hepatitis C Ab: NONREACTIVE
SIGNAL TO CUT-OFF: 0.01 (ref <1.00)

## 2019-10-01 LAB — HIV ANTIBODY (ROUTINE TESTING W REFLEX): HIV 1&2 Ab, 4th Generation: NONREACTIVE

## 2019-10-01 LAB — RPR: RPR Ser Ql: NONREACTIVE

## 2019-10-04 ENCOUNTER — Encounter: Payer: Self-pay | Admitting: Sports Medicine

## 2019-10-22 DIAGNOSIS — M6258 Muscle wasting and atrophy, not elsewhere classified, other site: Secondary | ICD-10-CM | POA: Diagnosis not present

## 2019-10-22 DIAGNOSIS — N393 Stress incontinence (female) (male): Secondary | ICD-10-CM | POA: Diagnosis not present

## 2019-10-25 ENCOUNTER — Other Ambulatory Visit: Payer: Self-pay

## 2019-10-25 DIAGNOSIS — F419 Anxiety disorder, unspecified: Secondary | ICD-10-CM

## 2019-10-25 DIAGNOSIS — F329 Major depressive disorder, single episode, unspecified: Secondary | ICD-10-CM

## 2019-10-25 DIAGNOSIS — Z780 Asymptomatic menopausal state: Secondary | ICD-10-CM

## 2019-10-25 DIAGNOSIS — F32A Depression, unspecified: Secondary | ICD-10-CM

## 2019-10-29 ENCOUNTER — Other Ambulatory Visit: Payer: Self-pay | Admitting: *Deleted

## 2019-10-29 DIAGNOSIS — F329 Major depressive disorder, single episode, unspecified: Secondary | ICD-10-CM

## 2019-10-29 DIAGNOSIS — Z780 Asymptomatic menopausal state: Secondary | ICD-10-CM

## 2019-10-29 DIAGNOSIS — F32A Depression, unspecified: Secondary | ICD-10-CM

## 2019-10-29 DIAGNOSIS — F419 Anxiety disorder, unspecified: Secondary | ICD-10-CM

## 2019-10-29 MED ORDER — ALPRAZOLAM 1 MG PO TABS: 1.0000 mg | ORAL_TABLET | Freq: Two times a day (BID) | ORAL | 0 refills | Status: DC | PRN

## 2019-10-29 MED ORDER — PREMARIN 0.625 MG/GM VA CREA: TOPICAL_CREAM | Freq: Every day | VAGINAL | 0 refills | Status: DC

## 2019-11-28 ENCOUNTER — Other Ambulatory Visit: Payer: Self-pay | Admitting: *Deleted

## 2019-11-28 DIAGNOSIS — F419 Anxiety disorder, unspecified: Secondary | ICD-10-CM

## 2019-11-28 DIAGNOSIS — F32A Depression, unspecified: Secondary | ICD-10-CM

## 2019-11-28 MED ORDER — ALPRAZOLAM 1 MG PO TABS: 1.0000 mg | ORAL_TABLET | Freq: Two times a day (BID) | ORAL | 0 refills | Status: DC | PRN

## 2019-12-27 ENCOUNTER — Other Ambulatory Visit: Payer: Self-pay | Admitting: Sports Medicine

## 2019-12-27 DIAGNOSIS — F419 Anxiety disorder, unspecified: Secondary | ICD-10-CM

## 2019-12-27 DIAGNOSIS — F32A Depression, unspecified: Secondary | ICD-10-CM

## 2019-12-28 ENCOUNTER — Other Ambulatory Visit: Payer: Self-pay | Admitting: *Deleted

## 2019-12-28 DIAGNOSIS — F419 Anxiety disorder, unspecified: Secondary | ICD-10-CM

## 2019-12-28 DIAGNOSIS — F32A Depression, unspecified: Secondary | ICD-10-CM

## 2020-01-07 DIAGNOSIS — G4700 Insomnia, unspecified: Secondary | ICD-10-CM | POA: Diagnosis not present

## 2020-01-07 DIAGNOSIS — M2550 Pain in unspecified joint: Secondary | ICD-10-CM | POA: Diagnosis not present

## 2020-01-07 DIAGNOSIS — E349 Endocrine disorder, unspecified: Secondary | ICD-10-CM | POA: Diagnosis not present

## 2020-01-07 DIAGNOSIS — M810 Age-related osteoporosis without current pathological fracture: Secondary | ICD-10-CM | POA: Diagnosis not present

## 2020-01-24 ENCOUNTER — Other Ambulatory Visit: Payer: Self-pay | Admitting: *Deleted

## 2020-01-24 DIAGNOSIS — F32A Depression, unspecified: Secondary | ICD-10-CM

## 2020-01-24 MED ORDER — ALPRAZOLAM 1 MG PO TABS: ORAL_TABLET | ORAL | 0 refills | Status: DC

## 2020-01-25 DIAGNOSIS — Z1231 Encounter for screening mammogram for malignant neoplasm of breast: Secondary | ICD-10-CM | POA: Diagnosis not present

## 2020-02-25 ENCOUNTER — Other Ambulatory Visit: Payer: Self-pay | Admitting: Sports Medicine

## 2020-02-25 ENCOUNTER — Other Ambulatory Visit: Payer: Self-pay | Admitting: *Deleted

## 2020-02-25 DIAGNOSIS — F419 Anxiety disorder, unspecified: Secondary | ICD-10-CM

## 2020-02-25 DIAGNOSIS — F329 Major depressive disorder, single episode, unspecified: Secondary | ICD-10-CM

## 2020-02-25 DIAGNOSIS — F32A Depression, unspecified: Secondary | ICD-10-CM

## 2020-02-25 MED ORDER — ALPRAZOLAM 1 MG PO TABS: 1.0000 mg | ORAL_TABLET | Freq: Two times a day (BID) | ORAL | 3 refills | Status: DC | PRN

## 2020-03-13 DIAGNOSIS — D485 Neoplasm of uncertain behavior of skin: Secondary | ICD-10-CM | POA: Diagnosis not present

## 2020-03-13 DIAGNOSIS — L814 Other melanin hyperpigmentation: Secondary | ICD-10-CM | POA: Diagnosis not present

## 2020-03-13 DIAGNOSIS — L821 Other seborrheic keratosis: Secondary | ICD-10-CM | POA: Diagnosis not present

## 2020-03-14 ENCOUNTER — Telehealth: Payer: Self-pay

## 2020-03-14 NOTE — Telephone Encounter (Signed)
Patient left msg stating that her urine is orange/red in color in the mornings only but it has a very foul smell all day. She was wondering about lab work  Please call and schedule patient for in person evaluation for urinary symptoms. Cannot order labs for this without evaluation.

## 2020-03-17 NOTE — Telephone Encounter (Signed)
Appointment made for Wednesday. Did offer times today but patient declined and could not come in until Wednesday. No further questions at this time.

## 2020-03-19 ENCOUNTER — Ambulatory Visit: Payer: BLUE CROSS/BLUE SHIELD | Admitting: Family Medicine

## 2020-03-20 DIAGNOSIS — R197 Diarrhea, unspecified: Secondary | ICD-10-CM | POA: Diagnosis not present

## 2020-03-20 DIAGNOSIS — R05 Cough: Secondary | ICD-10-CM | POA: Diagnosis not present

## 2020-03-20 DIAGNOSIS — Z20822 Contact with and (suspected) exposure to covid-19: Secondary | ICD-10-CM | POA: Diagnosis not present

## 2020-03-20 DIAGNOSIS — J988 Other specified respiratory disorders: Secondary | ICD-10-CM | POA: Diagnosis not present

## 2020-03-21 ENCOUNTER — Ambulatory Visit: Payer: BLUE CROSS/BLUE SHIELD | Admitting: Medical-Surgical

## 2020-05-13 DIAGNOSIS — R1010 Upper abdominal pain, unspecified: Secondary | ICD-10-CM | POA: Diagnosis not present

## 2020-05-13 DIAGNOSIS — K219 Gastro-esophageal reflux disease without esophagitis: Secondary | ICD-10-CM | POA: Diagnosis not present

## 2020-05-13 DIAGNOSIS — Z86010 Personal history of colonic polyps: Secondary | ICD-10-CM | POA: Diagnosis not present

## 2020-05-13 DIAGNOSIS — Z9049 Acquired absence of other specified parts of digestive tract: Secondary | ICD-10-CM | POA: Diagnosis not present

## 2020-05-13 DIAGNOSIS — K581 Irritable bowel syndrome with constipation: Secondary | ICD-10-CM | POA: Diagnosis not present

## 2020-05-20 DIAGNOSIS — R1010 Upper abdominal pain, unspecified: Secondary | ICD-10-CM | POA: Diagnosis not present

## 2020-05-20 DIAGNOSIS — R1013 Epigastric pain: Secondary | ICD-10-CM | POA: Diagnosis not present

## 2020-05-20 DIAGNOSIS — Z9049 Acquired absence of other specified parts of digestive tract: Secondary | ICD-10-CM | POA: Diagnosis not present

## 2020-05-20 DIAGNOSIS — K838 Other specified diseases of biliary tract: Secondary | ICD-10-CM | POA: Diagnosis not present

## 2020-05-20 DIAGNOSIS — K7689 Other specified diseases of liver: Secondary | ICD-10-CM | POA: Diagnosis not present

## 2020-05-26 ENCOUNTER — Telehealth: Payer: Self-pay

## 2020-05-26 DIAGNOSIS — F32A Depression, unspecified: Secondary | ICD-10-CM

## 2020-05-26 DIAGNOSIS — F419 Anxiety disorder, unspecified: Secondary | ICD-10-CM

## 2020-05-26 DIAGNOSIS — Z113 Encounter for screening for infections with a predominantly sexual mode of transmission: Secondary | ICD-10-CM

## 2020-05-26 MED ORDER — ALPRAZOLAM 1 MG PO TABS: 1.0000 mg | ORAL_TABLET | Freq: Two times a day (BID) | ORAL | 3 refills | Status: DC | PRN
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
## 2020-05-26 NOTE — Telephone Encounter (Signed)
Done and done

## 2020-05-26 NOTE — Telephone Encounter (Signed)
Patient aware that both requests had been completed.

## 2020-05-27 DIAGNOSIS — Z113 Encounter for screening for infections with a predominantly sexual mode of transmission: Secondary | ICD-10-CM | POA: Diagnosis not present

## 2020-05-28 LAB — C. TRACHOMATIS/N. GONORRHOEAE RNA
C. trachomatis RNA, TMA: NOT DETECTED
N. gonorrhoeae RNA, TMA: NOT DETECTED

## 2020-05-28 LAB — HEPATITIS PANEL, ACUTE
Hep A IgM: NONREACTIVE
Hep B C IgM: NONREACTIVE
Hepatitis B Surface Ag: NONREACTIVE
Hepatitis C Ab: NONREACTIVE
SIGNAL TO CUT-OFF: 0 (ref <1.00)

## 2020-05-28 LAB — RPR: RPR Ser Ql: NONREACTIVE

## 2020-05-28 LAB — HIV ANTIBODY (ROUTINE TESTING W REFLEX): HIV 1&2 Ab, 4th Generation: NONREACTIVE

## 2020-06-10 DIAGNOSIS — R1013 Epigastric pain: Secondary | ICD-10-CM | POA: Diagnosis not present

## 2020-06-10 DIAGNOSIS — K219 Gastro-esophageal reflux disease without esophagitis: Secondary | ICD-10-CM | POA: Diagnosis not present

## 2020-06-10 DIAGNOSIS — F458 Other somatoform disorders: Secondary | ICD-10-CM | POA: Diagnosis not present

## 2020-06-10 DIAGNOSIS — R142 Eructation: Secondary | ICD-10-CM | POA: Diagnosis not present

## 2020-06-16 DIAGNOSIS — R1013 Epigastric pain: Secondary | ICD-10-CM | POA: Diagnosis not present

## 2020-06-16 DIAGNOSIS — K2270 Barrett's esophagus without dysplasia: Secondary | ICD-10-CM | POA: Diagnosis not present

## 2020-06-16 DIAGNOSIS — R142 Eructation: Secondary | ICD-10-CM | POA: Diagnosis not present

## 2020-06-23 ENCOUNTER — Other Ambulatory Visit: Payer: Self-pay

## 2020-06-23 DIAGNOSIS — F32A Depression, unspecified: Secondary | ICD-10-CM

## 2020-06-23 DIAGNOSIS — F419 Anxiety disorder, unspecified: Secondary | ICD-10-CM

## 2020-06-23 MED ORDER — ALPRAZOLAM 1 MG PO TABS: 1.0000 mg | ORAL_TABLET | Freq: Two times a day (BID) | ORAL | 3 refills | Status: DC | PRN

## 2020-06-26 DIAGNOSIS — H6502 Acute serous otitis media, left ear: Secondary | ICD-10-CM | POA: Diagnosis not present

## 2020-06-26 DIAGNOSIS — H6122 Impacted cerumen, left ear: Secondary | ICD-10-CM | POA: Diagnosis not present

## 2020-07-22 ENCOUNTER — Other Ambulatory Visit: Payer: Self-pay | Admitting: *Deleted

## 2020-07-22 DIAGNOSIS — F32A Depression, unspecified: Secondary | ICD-10-CM

## 2020-07-22 DIAGNOSIS — F419 Anxiety disorder, unspecified: Secondary | ICD-10-CM

## 2020-07-22 MED ORDER — ALPRAZOLAM 1 MG PO TABS: 1.0000 mg | ORAL_TABLET | Freq: Two times a day (BID) | ORAL | 3 refills | Status: DC | PRN

## 2020-08-19 ENCOUNTER — Other Ambulatory Visit: Payer: Self-pay

## 2020-08-19 DIAGNOSIS — F32A Depression, unspecified: Secondary | ICD-10-CM

## 2020-10-22 ENCOUNTER — Other Ambulatory Visit: Payer: Self-pay | Admitting: Sports Medicine

## 2020-10-22 MED ORDER — DEXLANSOPRAZOLE 60 MG PO CPDR
60.0000 mg | DELAYED_RELEASE_CAPSULE | Freq: Every day | ORAL | 3 refills | Status: DC
Start: 2020-10-22 — End: 2021-03-23

## 2020-11-17 ENCOUNTER — Other Ambulatory Visit: Payer: Self-pay

## 2020-11-17 DIAGNOSIS — F32A Depression, unspecified: Secondary | ICD-10-CM

## 2020-11-17 DIAGNOSIS — F419 Anxiety disorder, unspecified: Secondary | ICD-10-CM

## 2020-11-17 NOTE — Telephone Encounter (Signed)
Patient called in for a refill on her alprazolam. She has not had an actual appt since July 2020.

## 2020-11-18 NOTE — Telephone Encounter (Signed)
Patient aware she needs an appt to get refills. Call transferred to front desk to schedule.

## 2020-11-24 ENCOUNTER — Encounter: Payer: Self-pay | Admitting: Sports Medicine

## 2020-11-24 ENCOUNTER — Ambulatory Visit: Payer: BLUE CROSS/BLUE SHIELD | Admitting: Sports Medicine

## 2020-11-24 ENCOUNTER — Other Ambulatory Visit: Payer: Self-pay

## 2020-11-24 ENCOUNTER — Ambulatory Visit (INDEPENDENT_AMBULATORY_CARE_PROVIDER_SITE_OTHER): Payer: BLUE CROSS/BLUE SHIELD | Admitting: Sports Medicine

## 2020-11-24 VITALS — BP 145/76 | HR 73 | Ht 61.0 in | Wt 142.0 lb

## 2020-11-24 DIAGNOSIS — M7742 Metatarsalgia, left foot: Secondary | ICD-10-CM | POA: Diagnosis not present

## 2020-11-24 DIAGNOSIS — R319 Hematuria, unspecified: Secondary | ICD-10-CM

## 2020-11-24 DIAGNOSIS — K56609 Unspecified intestinal obstruction, unspecified as to partial versus complete obstruction: Secondary | ICD-10-CM

## 2020-11-24 DIAGNOSIS — F419 Anxiety disorder, unspecified: Secondary | ICD-10-CM

## 2020-11-24 DIAGNOSIS — B081 Molluscum contagiosum: Secondary | ICD-10-CM | POA: Insufficient documentation

## 2020-11-24 DIAGNOSIS — Z1231 Encounter for screening mammogram for malignant neoplasm of breast: Secondary | ICD-10-CM | POA: Diagnosis not present

## 2020-11-24 DIAGNOSIS — E785 Hyperlipidemia, unspecified: Secondary | ICD-10-CM

## 2020-11-24 DIAGNOSIS — F32A Depression, unspecified: Secondary | ICD-10-CM

## 2020-11-24 MED ORDER — ALPRAZOLAM 1 MG PO TABS: 1.0000 mg | ORAL_TABLET | Freq: Two times a day (BID) | ORAL | 3 refills | Status: DC | PRN

## 2020-11-24 MED ORDER — PODOFILOX 0.5 % EX GEL: CUTANEOUS | 0 refills | Status: DC

## 2020-11-24 NOTE — Progress Notes (Addendum)
    Procedures performed today:    None.  Independent interpretation of notes and tests performed by another provider:   None.  Brief History, Exam, Impression, and Recommendations:    Metatarsalgia, left foot Left foot metatarsalgia with minor numbness and tingling, breakdown of the transverse arch with abnormal callus. Known plantar fascial fibromatosis. Tenderness under the metatarsal head, we added a left-sided metatarsal pad, I gave her a few more with instructions on how to place them in her shoes. She does tend to wear heels a lot which are likely contributing.  Hematuria Gross hematuria with right-sided flank pain for several weeks now. It sounds like she [facility] and got some antibiotics. As she continues to have flank pain we certainly need to rule out structural abnormality such as a renal mass, cyst, or nephrolith. Adding a repeat urinalysis, as well as renal ultrasound. Treatment will depend on findings.  Update 12/01/2020 ultrasound unrevealing, proceeding with CT renal with contrast.  Update 12/08/2020: Entire renal/urinary system unremarkable.  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  Anxiety and depression Overall doing well. Refilling alprazolam.  Molluscum contagiosum Multiple molluscum contagiosum lesions on her lower abdomen. We will start with Podofilox for a few weeks before considering cryotherapy.    ___________________________________________ Ihor Austin. Benjamin Stain, M.D., ABFM., CAQSM. Primary Care and Sports Medicine [facility]  Adjunct Instructor of Family Medicine  [employer]

## 2020-11-24 NOTE — Assessment & Plan Note (Signed)
Multiple molluscum contagiosum lesions on her lower abdomen. We will start with Podofilox for a few weeks before considering cryotherapy.

## 2020-11-24 NOTE — Assessment & Plan Note (Addendum)
Gross hematuria with right-sided flank pain for several weeks now. It sounds like she [facility] to a [facility] and got some antibiotics. As she continues to have flank pain we certainly need to rule out structural abnormality such as a renal mass, cyst, or nephrolith. Adding a repeat urinalysis, as well as renal ultrasound. Treatment will depend on findings.  Update 12/01/2020 ultrasound unrevealing, proceeding with CT renal with contrast.  Update 12/08/2020: Entire renal/urinary system unremarkable.  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

## 2020-11-24 NOTE — Patient Instructions (Signed)
Molluscum Contagiosum, Adult Molluscum contagiosum is a skin infection that can cause a rash. The rash may go away on its own, or it may need to be treated with a procedure or medicine. What are the causes? This condition is caused by a virus. The virus is contagious. This means that it can spread from person to person. It can spread through:  Skin-to-skin contact with an infected person.  Contact with an object that has the virus on it, such as a towel or clothing.  Sexual activity. What increases the risk? You may be more likely to develop this condition if:  You live in an area where the weather is moist and warm.  You have a weak disease-fighting system (immune system). What are the signs or symptoms? The main symptom of this condition is a painless rash that appears 2-7 weeks after exposure to the virus. The rash is made up of small, dome-shaped bumps on the skin. The bumps may:  Affect the genitals, thighs, face, neck, or abdomen.  Be pink or flesh-colored.  Appear one by one or in groups.  Range from the size of a pinhead to the size of a pencil eraser.  Feel firm, smooth, and waxy.  Have a pit in the middle.  Itch. For most people, the rash does not itch.   How is this diagnosed? This condition may be diagnosed based on:  Your symptoms and medical history.  A physical exam.  Scraping the bumps to collect a skin sample for testing. How is this treated? The rash usually goes away within 2 months, but it can sometimes take 6-12 months for it to clear completely. For some people, the rash may go away on its own without treatment. In some cases, treatment may be needed to keep the virus from infecting other people or to keep the rash from spreading to other parts of your body. Treatment may also be done if you have anxiety or stress because of the way the rash looks. If needed, treatment may include:  Surgery to remove the bumps by freezing them (cryosurgery).  A  procedure to scrape off the bumps (curettage).  A procedure to remove the bumps with a laser.  Putting medicine on the bumps (topical treatment). Follow these instructions at home:  Take or apply over-the-counter and prescription medicines only as told by your health care provider.  Do not scratch or pick at the bumps. Scratching or picking can cause the rash to spread to other parts of your body. How is this prevented? As long as you have bumps on your skin, the infection can spread to other people. To prevent this from happening:  Do not share clothing or towels with others until the bumps go away.  Do not use a public swimming pool, sauna, or shower until the bumps go away.  Avoid close contact with others until the bumps go away. This includes sexual contact.  Wash your hands often with soap and water. If soap and water are not available, use hand sanitizer.  Cover the bumps with clothing or a bandage when you will be near other people. Contact a health care provider if:  The bumps are spreading.  The bumps are becoming red and sore.  The bumps have not gone away after 12 months. Summary  Molluscum contagiosum is a skin infection that can cause a rash made up of small, dome-shaped bumps.  The infection is caused by a virus.  The rash usually goes away within 2  months, but it can sometimes take 6-12 months for it to clear completely.  The rash often goes away on its own. However, treatment is sometimes recommended to keep the virus from infecting other people or to keep it from spreading to other parts of your body. This information is not intended to replace advice given to you by your health care provider. Make sure you discuss any questions you have with your health care provider. Document Revised: 03/17/2020 Document Reviewed: 03/17/2020 Elsevier Patient Education  2021 [employer].

## 2020-11-24 NOTE — Assessment & Plan Note (Signed)
Overall doing well. Refilling alprazolam.

## 2020-11-24 NOTE — Assessment & Plan Note (Signed)
Left foot metatarsalgia with minor numbness and tingling, breakdown of the transverse arch with abnormal callus. Known plantar fascial fibromatosis. Tenderness under the metatarsal head, we added a left-sided metatarsal pad, I gave her a few more with instructions on how to place them in her shoes. She does tend to wear heels a lot which are likely contributing.

## 2020-11-25 ENCOUNTER — Ambulatory Visit (INDEPENDENT_AMBULATORY_CARE_PROVIDER_SITE_OTHER): Payer: BLUE CROSS/BLUE SHIELD

## 2020-11-25 DIAGNOSIS — R319 Hematuria, unspecified: Secondary | ICD-10-CM | POA: Diagnosis not present

## 2020-11-25 LAB — CBC
HCT: 41 % (ref 35.0–45.0)
Hemoglobin: 13.9 g/dL (ref 11.7–15.5)
MCH: 32.6 pg (ref 27.0–33.0)
MCHC: 33.9 g/dL (ref 32.0–36.0)
MCV: 96.2 fL (ref 80.0–100.0)
MPV: 11.6 fL (ref 7.5–12.5)
Platelets: 249 10*3/uL (ref 140–400)
RBC: 4.26 10*6/uL (ref 3.80–5.10)
RDW: 12.7 % (ref 11.0–15.0)
WBC: 7.8 10*3/uL (ref 3.8–10.8)

## 2020-11-25 LAB — URINALYSIS W MICROSCOPIC + REFLEX CULTURE
Bacteria, UA: NONE SEEN /HPF
Bilirubin Urine: NEGATIVE
Glucose, UA: NEGATIVE
Hyaline Cast: NONE SEEN /LPF
Ketones, ur: NEGATIVE
Leukocyte Esterase: NEGATIVE
Nitrites, Initial: NEGATIVE
Protein, ur: NEGATIVE
Specific Gravity, Urine: 1.017 (ref 1.001–1.035)
WBC, UA: NONE SEEN /HPF (ref 0–5)
pH: 6 (ref 5.0–8.0)

## 2020-11-25 LAB — COMPREHENSIVE METABOLIC PANEL
AG Ratio: 1.8 (calc) (ref 1.0–2.5)
ALT: 12 U/L (ref 6–29)
AST: 18 U/L (ref 10–35)
Albumin: 4.6 g/dL (ref 3.6–5.1)
Alkaline phosphatase (APISO): 40 U/L (ref 37–153)
BUN: 14 mg/dL (ref 7–25)
CO2: 27 mmol/L (ref 20–32)
Calcium: 9.7 mg/dL (ref 8.6–10.4)
Chloride: 101 mmol/L (ref 98–110)
Creat: 0.73 mg/dL (ref 0.50–0.99)
Globulin: 2.6 g/dL (calc) (ref 1.9–3.7)
Glucose, Bld: 93 mg/dL (ref 65–99)
Potassium: 4.3 mmol/L (ref 3.5–5.3)
Sodium: 136 mmol/L (ref 135–146)
Total Bilirubin: 0.4 mg/dL (ref 0.2–1.2)
Total Protein: 7.2 g/dL (ref 6.1–8.1)

## 2020-11-25 LAB — LIPID PANEL
Cholesterol: 213 mg/dL — ABNORMAL HIGH (ref <200)
HDL: 76 mg/dL (ref >=50)
LDL Cholesterol (Calc): 107 mg/dL (calc) — ABNORMAL HIGH
Non-HDL Cholesterol (Calc): 137 mg/dL (calc) — ABNORMAL HIGH (ref <130)
Total CHOL/HDL Ratio: 2.8 (calc) (ref <5.0)
Triglycerides: 188 mg/dL — ABNORMAL HIGH (ref <150)

## 2020-11-25 LAB — NO CULTURE INDICATED

## 2020-11-25 LAB — TSH: TSH: 1.54 mIU/L (ref 0.40–4.50)

## 2020-12-01 NOTE — Addendum Note (Signed)
Addended by: Monica Becton on: 12/01/2020 01:08 PM   Modules accepted: Orders

## 2020-12-03 ENCOUNTER — Telehealth: Payer: Self-pay

## 2020-12-03 NOTE — Telephone Encounter (Signed)
Patient called to check on the status of her CT scan. She is a little anxious.

## 2020-12-03 NOTE — Telephone Encounter (Signed)
Ins approval came in today.  Imaging dept notified and will contact pt to get scheduled.

## 2020-12-06 ENCOUNTER — Other Ambulatory Visit: Payer: Self-pay

## 2020-12-06 ENCOUNTER — Ambulatory Visit (INDEPENDENT_AMBULATORY_CARE_PROVIDER_SITE_OTHER): Payer: BLUE CROSS/BLUE SHIELD

## 2020-12-06 DIAGNOSIS — R319 Hematuria, unspecified: Secondary | ICD-10-CM | POA: Diagnosis not present

## 2020-12-07 IMAGING — DX RIGHT HAND - COMPLETE 3+ VIEW
3 series · 3 of 3 positions shown · non-contrast
Comparison: Bilateral hand x-rays dated August 15, 2013.

CLINICAL DATA: Chronic bilateral hand pain.

EXAM:
LEFT HAND - COMPLETE 3+ VIEW; RIGHT HAND - COMPLETE 3+ VIEW

[hand pa]
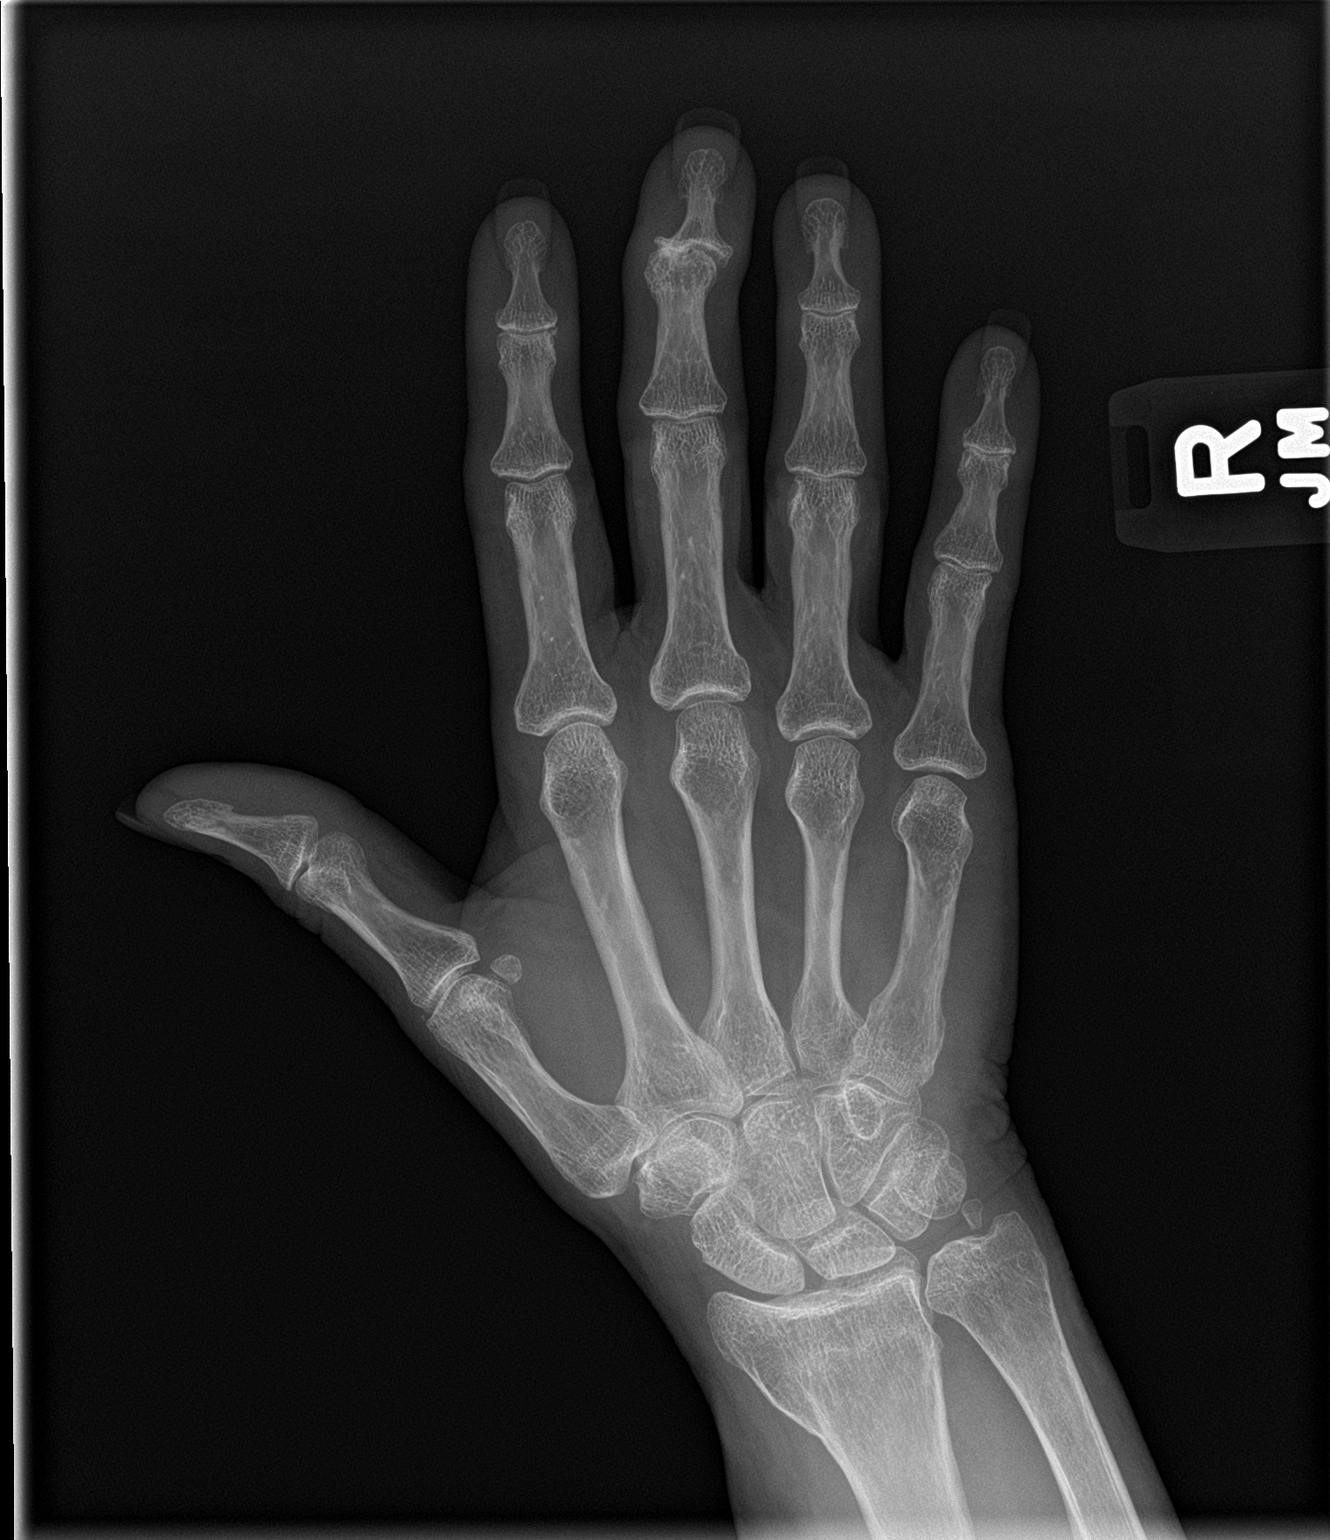

[hand obl]
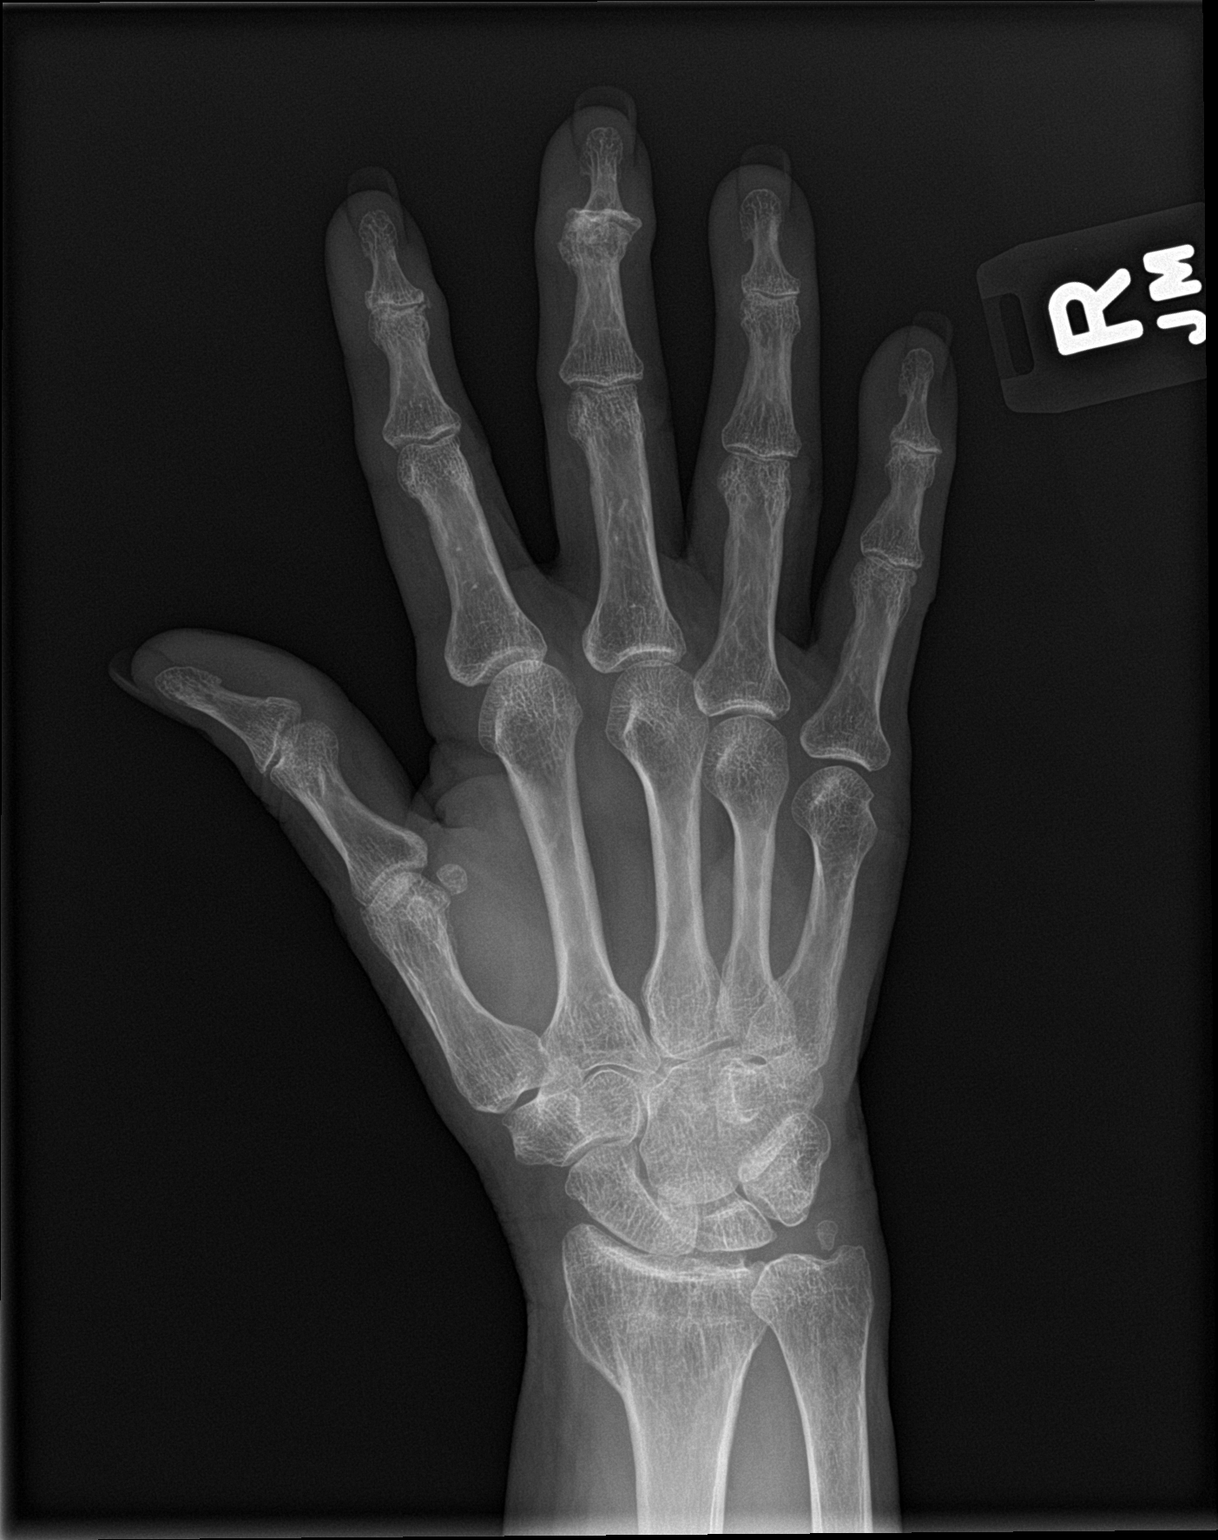

[hand lat]
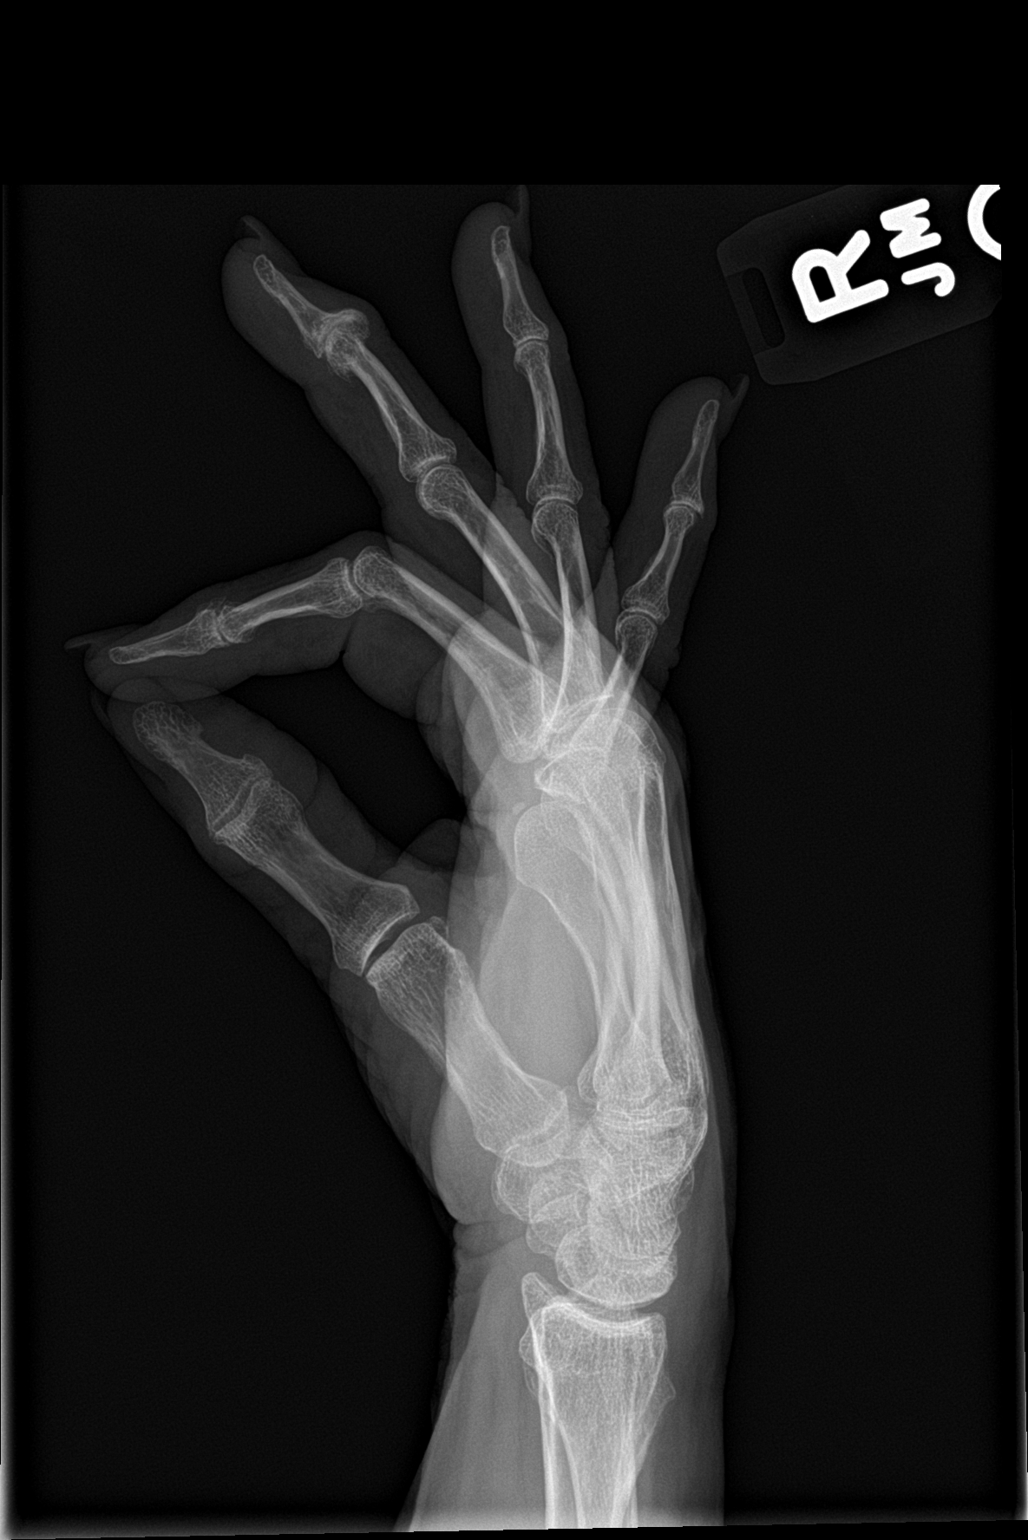

[3 of 3 positions shown; findings below may reference images not displayed]

FINDINGS: Left hand: No acute fracture or malalignment. No bony erosions or
periostitis. Progressive now severe osteoarthritis of the second DIP
joint. Unchanged mild third through fifth DIP osteoarthritis.
Osteopenia. Soft tissues are unremarkable. No chondrocalcinosis.

Right hand: No acute fracture or malalignment. Old healed fractures
of the distal radius and ulnar styloid again noted. No bony erosions
or periostitis. Slightly progressive severe third DIP joint
osteoarthritis with ulnar subluxation. Slightly progressed mild
osteoarthritis of the second and fifth DIP joints. Osteopenia. Soft
tissues are unremarkable. No chondrocalcinosis.
IMPRESSION: 1. Progressive bilateral DIP joint osteoarthritis as described
above, severe at the left second and right third DIP joints.

## 2020-12-07 IMAGING — DX LEFT HAND - COMPLETE 3+ VIEW
3 series · 3 of 3 positions shown · non-contrast
Comparison: Bilateral hand x-rays dated August 15, 2013.

CLINICAL DATA: Chronic bilateral hand pain.

EXAM:
LEFT HAND - COMPLETE 3+ VIEW; RIGHT HAND - COMPLETE 3+ VIEW

[hand pa]
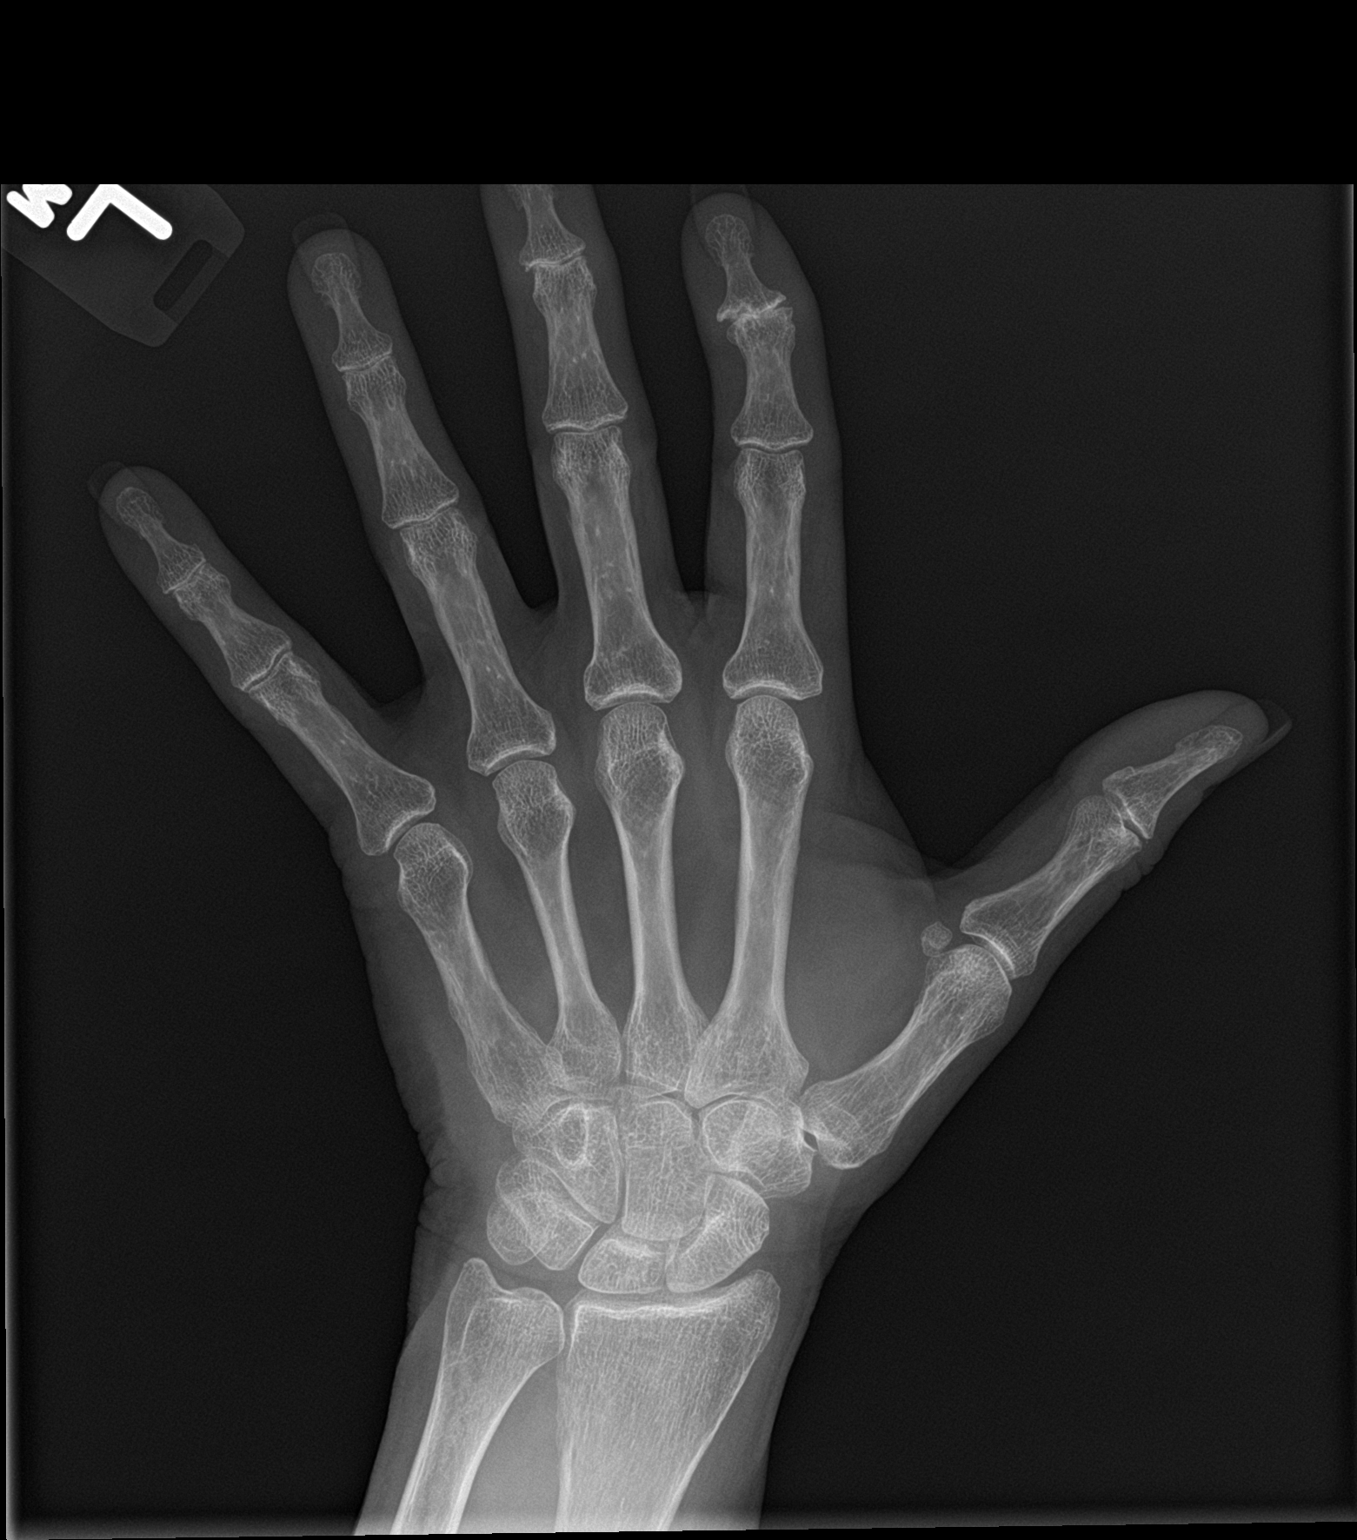

[hand obl]
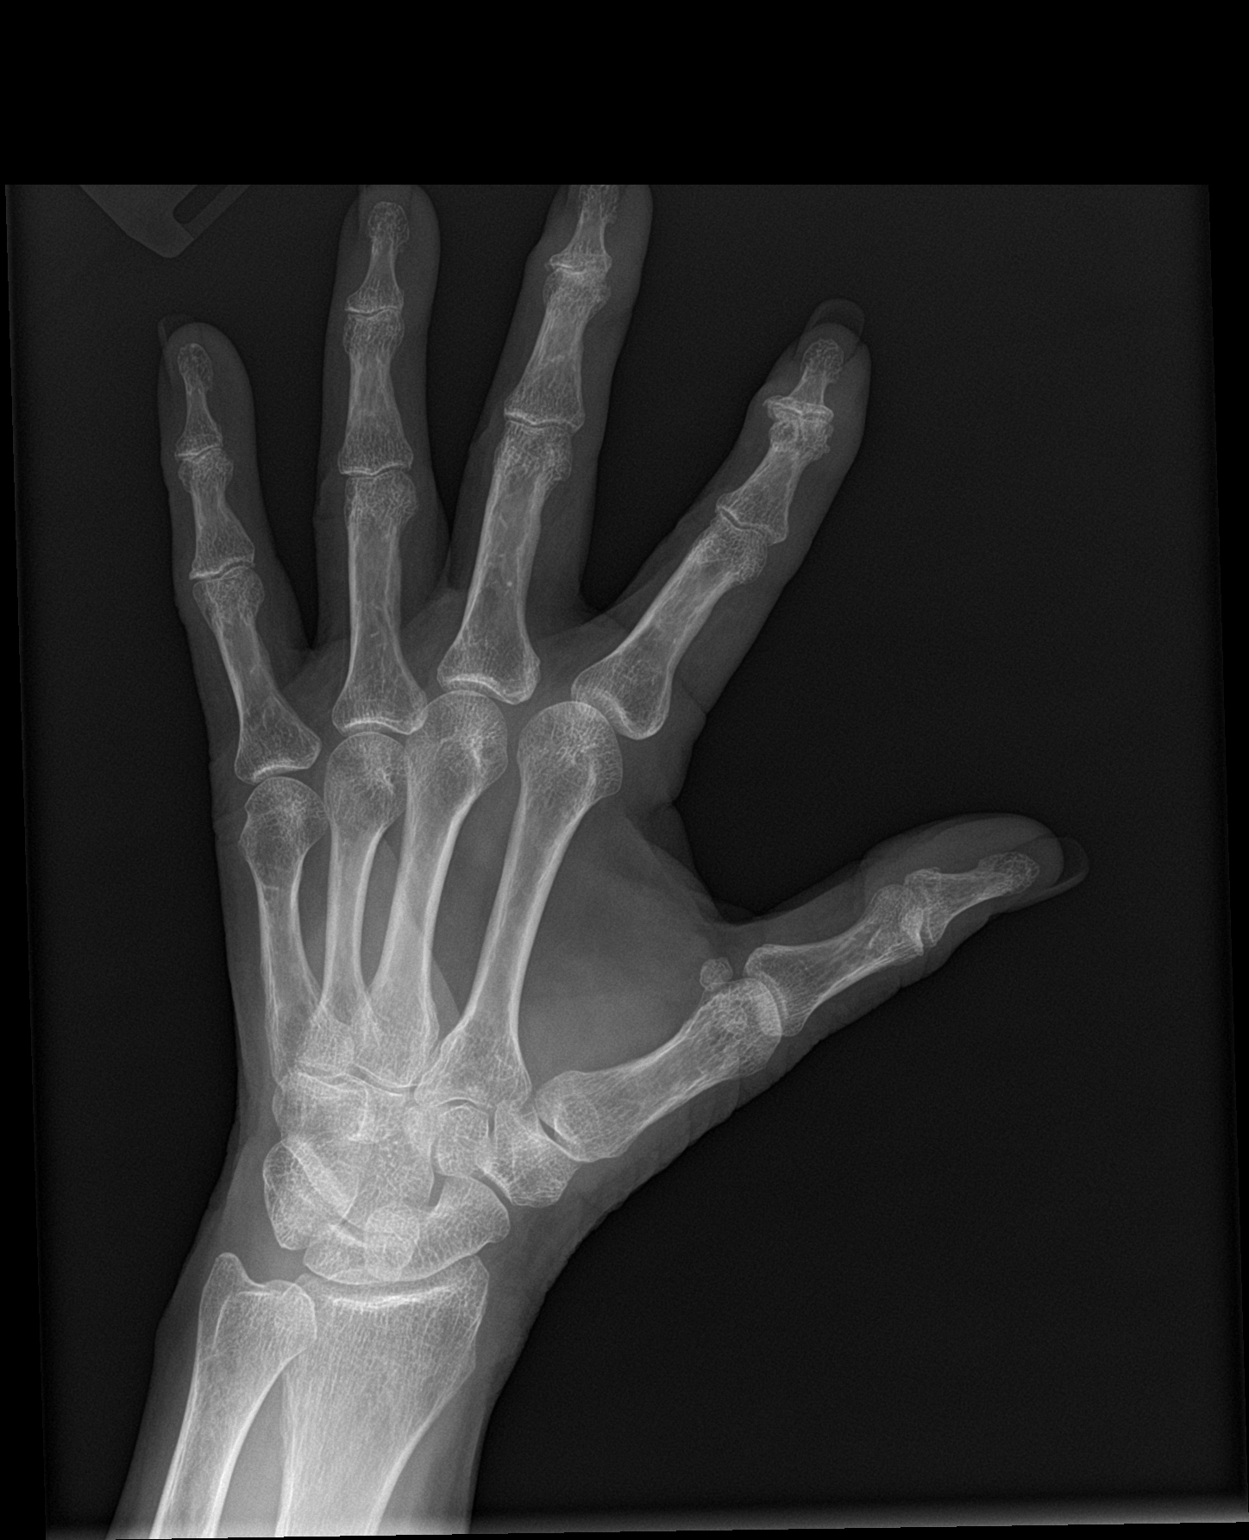

[hand lat]
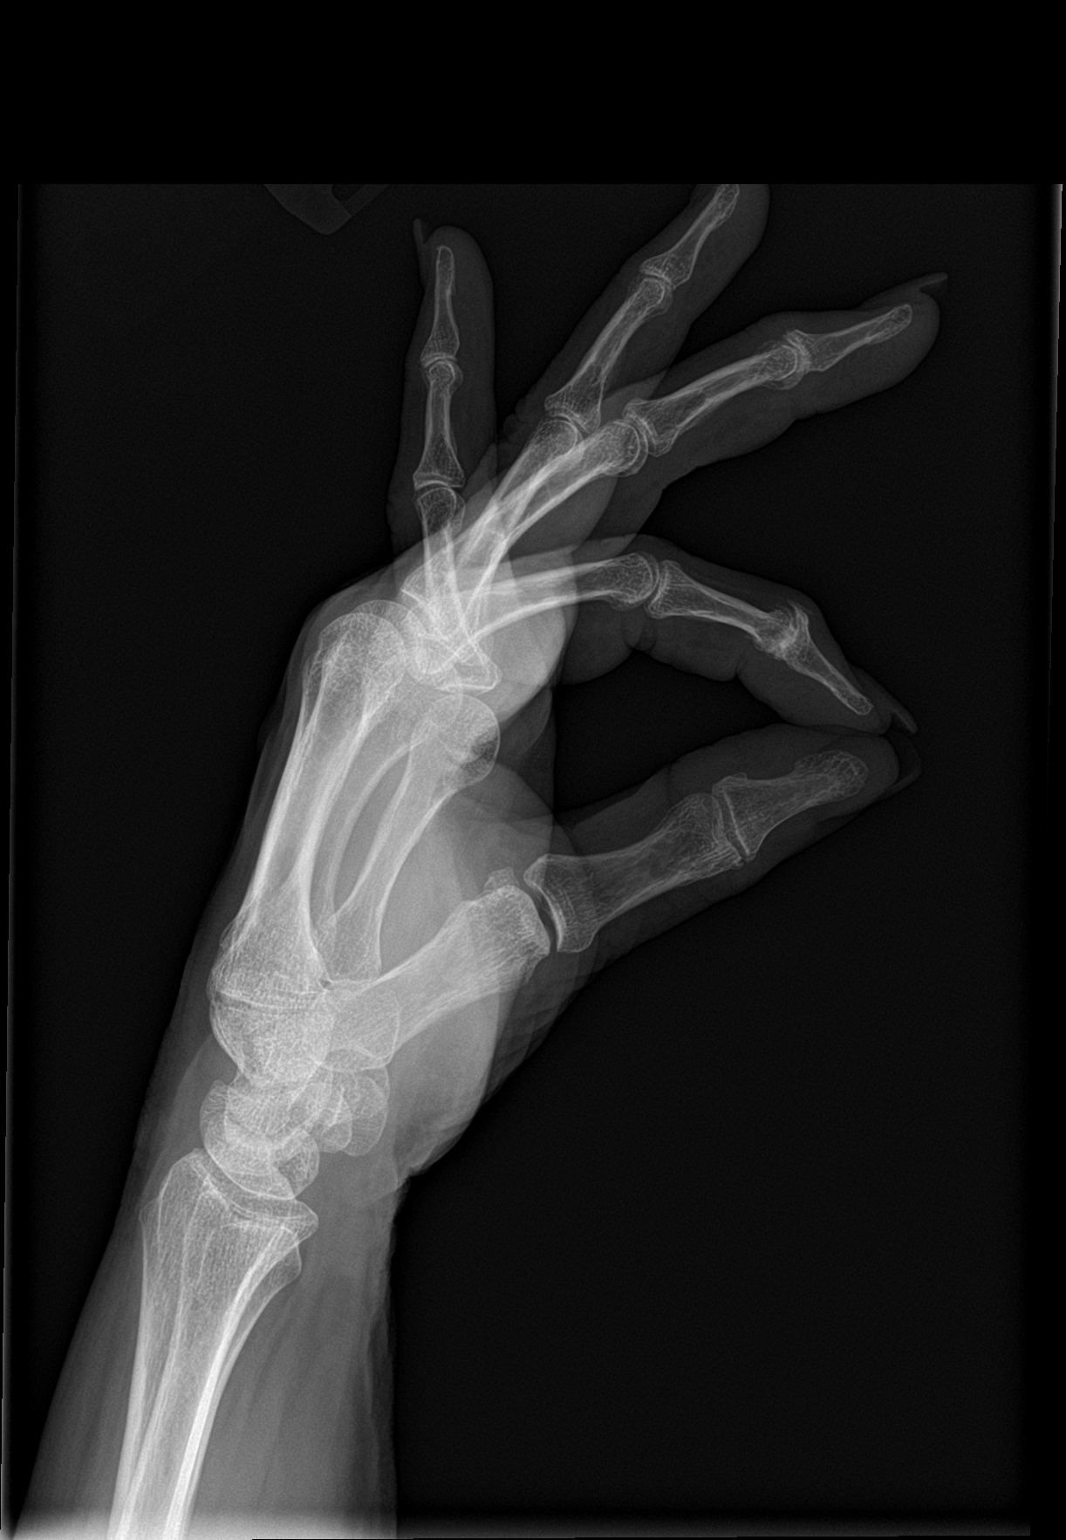

[3 of 3 positions shown; findings below may reference images not displayed]

FINDINGS: Left hand: No acute fracture or malalignment. No bony erosions or
periostitis. Progressive now severe osteoarthritis of the second DIP
joint. Unchanged mild third through fifth DIP osteoarthritis.
Osteopenia. Soft tissues are unremarkable. No chondrocalcinosis.

Right hand: No acute fracture or malalignment. Old healed fractures
of the distal radius and ulnar styloid again noted. No bony erosions
or periostitis. Slightly progressive severe third DIP joint
osteoarthritis with ulnar subluxation. Slightly progressed mild
osteoarthritis of the second and fifth DIP joints. Osteopenia. Soft
tissues are unremarkable. No chondrocalcinosis.
IMPRESSION: 1. Progressive bilateral DIP joint osteoarthritis as described
above, severe at the left second and right third DIP joints.

## 2020-12-08 NOTE — Addendum Note (Signed)
Addended by: Monica Becton on: 12/08/2020 01:16 PM   Modules accepted: Orders

## 2020-12-09 ENCOUNTER — Telehealth: Payer: Self-pay

## 2020-12-09 NOTE — Telephone Encounter (Signed)
Patient called to report she got notification the CT she has last week was denied due to being out of network. She would like someone to contact her.

## 2020-12-10 NOTE — Telephone Encounter (Signed)
Spoke with pt and advised her that we absolutely received an approval from [facility] and that she would not have been scheduled for her ct without the approval.  Pt voiced understanding and was very thankful.

## 2020-12-15 NOTE — Telephone Encounter (Signed)
If you got an approval with authorization then why aren't we just scheduling it?

## 2020-12-15 NOTE — Telephone Encounter (Signed)
Is it because I am out of network or our imaging is out of network?  I can always order it at an in network location.

## 2020-12-23 ENCOUNTER — Ambulatory Visit: Payer: BLUE CROSS/BLUE SHIELD | Admitting: Sports Medicine

## 2021-01-05 ENCOUNTER — Telehealth: Payer: Self-pay

## 2021-01-05 NOTE — Telephone Encounter (Signed)
Patient called to get the imaging results.

## 2021-01-05 NOTE — Telephone Encounter (Signed)
CT scan did not show anything relevant as a cause for blood in the urine, no evidence of malignancy, we can simply watch this for now.  There were some ovarian cysts that she will just need to follow-up with her OB/GYN.

## 2021-01-05 NOTE — Telephone Encounter (Signed)
Patient aware of Dr. Karie Schwalbe results and recommendations.

## 2021-03-23 ENCOUNTER — Ambulatory Visit (INDEPENDENT_AMBULATORY_CARE_PROVIDER_SITE_OTHER): Payer: Medicare Other | Admitting: Sports Medicine

## 2021-03-23 ENCOUNTER — Other Ambulatory Visit: Payer: Self-pay

## 2021-03-23 DIAGNOSIS — Z780 Asymptomatic menopausal state: Secondary | ICD-10-CM

## 2021-03-23 DIAGNOSIS — K2270 Barrett's esophagus without dysplasia: Secondary | ICD-10-CM | POA: Diagnosis not present

## 2021-03-23 DIAGNOSIS — I10 Essential (primary) hypertension: Secondary | ICD-10-CM | POA: Insufficient documentation

## 2021-03-23 DIAGNOSIS — F32A Depression, unspecified: Secondary | ICD-10-CM

## 2021-03-23 DIAGNOSIS — F419 Anxiety disorder, unspecified: Secondary | ICD-10-CM | POA: Diagnosis not present

## 2021-03-23 DIAGNOSIS — M7712 Lateral epicondylitis, left elbow: Secondary | ICD-10-CM | POA: Insufficient documentation

## 2021-03-23 DIAGNOSIS — K209 Esophagitis, unspecified without bleeding: Secondary | ICD-10-CM

## 2021-03-23 DIAGNOSIS — W19XXXA Unspecified fall, initial encounter: Secondary | ICD-10-CM

## 2021-03-23 DIAGNOSIS — B081 Molluscum contagiosum: Secondary | ICD-10-CM

## 2021-03-23 MED ORDER — DICYCLOMINE HCL 20 MG PO TABS: 20.0000 mg | ORAL_TABLET | Freq: Four times a day (QID) | ORAL | 3 refills | Status: DC

## 2021-03-23 MED ORDER — ALPRAZOLAM 1 MG PO TABS: 1.0000 mg | ORAL_TABLET | Freq: Two times a day (BID) | ORAL | 3 refills | Status: DC | PRN

## 2021-03-23 MED ORDER — VALSARTAN 80 MG PO TABS: 80.0000 mg | ORAL_TABLET | Freq: Every day | ORAL | 3 refills | Status: DC

## 2021-03-23 MED ORDER — PREMARIN 0.625 MG/GM VA CREA: TOPICAL_CREAM | Freq: Every day | VAGINAL | 11 refills | Status: DC

## 2021-03-23 MED ORDER — DEXLANSOPRAZOLE 60 MG PO CPDR: 60.0000 mg | DELAYED_RELEASE_CAPSULE | Freq: Every day | ORAL | 3 refills | Status: DC

## 2021-03-23 NOTE — Progress Notes (Signed)
    Procedures performed today:    None.  Independent interpretation of notes and tests performed by another provider:   None.  Brief History, Exam, Impression, and Recommendations:    Benign essential hypertension This pleasant 65 year old female continues to have intermittent elevated blood pressures, 140s today but it has been as high as the 190s systolic with visual changes. At this juncture I think we need to start a low-dose blood pressure medication, we will use valsartan 80 mg to start. Nurse visit in 2 weeks to recheck blood pressure.  Barrett's esophagus with esophagitis Continue with Dexilant, she does need to touch base with her gastroenterologist, it sounds like she did not get along well with Dr. Caryl Never so she will try to switch to Dr. Jason Fila.  Accidental fall There is also an accidental trip and fall about a month ago, she has pain in her left elbow, left ankle, left ankle pain is behind the malleolus laterally consistent with peroneal strain, adding some conditioning exercises, her elbow seems to be medial epicondylitis, adding conditioning exercises, if not better in a month we will consider an injection.    ___________________________________________ Ihor Austin. Benjamin Stain, M.D., ABFM., CAQSM. Primary Care and Sports Medicine [facility]  Adjunct Instructor of Family Medicine  [employer]

## 2021-03-23 NOTE — Assessment & Plan Note (Signed)
There is also an accidental trip and fall about a month ago, she has pain in her left elbow, left ankle, left ankle pain is behind the malleolus laterally consistent with peroneal strain, adding some conditioning exercises, her elbow seems to be medial epicondylitis, adding conditioning exercises, if not better in a month we will consider an injection.

## 2021-03-23 NOTE — Addendum Note (Signed)
Addended by: Monica Becton on: 03/23/2021 01:26 PM   Modules accepted: Orders

## 2021-03-23 NOTE — Assessment & Plan Note (Signed)
Continue with Dexilant, she does need to touch base with her gastroenterologist, it sounds like she did not get along well with Dr. Caryl Never so she will try to switch to Dr. Jason Fila.

## 2021-03-23 NOTE — Assessment & Plan Note (Signed)
This pleasant 65 year old female continues to have intermittent elevated blood pressures, 140s today but it has been as high as the 190s systolic with visual changes. At this juncture I think we need to start a low-dose blood pressure medication, we will use valsartan 80 mg to start. Nurse visit in 2 weeks to recheck blood pressure.

## 2021-03-23 NOTE — Addendum Note (Signed)
Addended by: Gaynelle Arabian on: 03/23/2021 01:13 PM   Modules accepted: Orders

## 2021-04-02 ENCOUNTER — Telehealth: Payer: Self-pay

## 2021-04-02 NOTE — Telephone Encounter (Signed)
Medication: conjugated estrogens (PREMARIN) vaginal cream Prior authorization submitted via CoverMyMeds on 04/02/2021 PA submission pending Pt aware via MyChart

## 2021-04-02 NOTE — Telephone Encounter (Signed)
Medication: conjugated estrogens (PREMARIN) vaginal cream Prior authorization determination received Medication has been approved Approval dates: 02/23/2021-04/02/2022  Patient aware via: MyChart Pharmacy aware: Yes Provider aware via this encounter

## 2021-04-06 ENCOUNTER — Other Ambulatory Visit: Payer: Self-pay

## 2021-04-06 ENCOUNTER — Ambulatory Visit: Payer: Medicare Other

## 2021-04-21 ENCOUNTER — Ambulatory Visit: Payer: BLUE CROSS/BLUE SHIELD | Admitting: Sports Medicine

## 2021-05-14 LAB — HM MAMMOGRAPHY

## 2021-05-19 ENCOUNTER — Other Ambulatory Visit: Payer: Self-pay

## 2021-05-19 DIAGNOSIS — F419 Anxiety disorder, unspecified: Secondary | ICD-10-CM

## 2021-05-19 DIAGNOSIS — K581 Irritable bowel syndrome with constipation: Secondary | ICD-10-CM

## 2021-05-19 DIAGNOSIS — F32A Depression, unspecified: Secondary | ICD-10-CM

## 2021-05-19 MED ORDER — DICYCLOMINE HCL 10 MG PO CAPS
10.0000 mg | ORAL_CAPSULE | Freq: Three times a day (TID) | ORAL | 11 refills | Status: DC | PRN
Start: 2021-05-19 — End: 2021-05-19

## 2021-05-19 NOTE — Telephone Encounter (Signed)
Patient is also requesting an early refill on Xanax as she will be out of town when it is due for refill.   Meds have been pended .

## 2021-05-20 MED ORDER — ALPRAZOLAM 1 MG PO TABS: 1.0000 mg | ORAL_TABLET | Freq: Two times a day (BID) | ORAL | 3 refills | Status: DC | PRN

## 2021-06-10 ENCOUNTER — Other Ambulatory Visit: Payer: Self-pay | Admitting: Sports Medicine

## 2021-06-10 MED ORDER — DICYCLOMINE HCL 20 MG PO TABS: 20.0000 mg | ORAL_TABLET | Freq: Four times a day (QID) | ORAL | 3 refills | Status: DC

## 2021-06-10 NOTE — Addendum Note (Signed)
Addended by: Monica Becton on: 06/10/2021 03:38 PM   Modules accepted: Orders

## 2021-06-10 NOTE — Telephone Encounter (Signed)
Med not in patient's current med list

## 2021-06-13 ENCOUNTER — Other Ambulatory Visit: Payer: Self-pay | Admitting: Sports Medicine

## 2021-06-13 DIAGNOSIS — I10 Essential (primary) hypertension: Secondary | ICD-10-CM

## 2021-06-17 ENCOUNTER — Ambulatory Visit: Payer: BLUE CROSS/BLUE SHIELD | Admitting: Sports Medicine

## 2021-07-01 ENCOUNTER — Telehealth: Payer: Self-pay

## 2021-07-01 MED ORDER — MAGIC MOUTHWASH W/LIDOCAINE: 10.0000 mL | Freq: Four times a day (QID) | ORAL | 1 refills | Status: DC

## 2021-07-01 NOTE — Telephone Encounter (Signed)
Patient advised.

## 2021-07-01 NOTE — Telephone Encounter (Signed)
Prescription faxed off.

## 2021-07-01 NOTE — Telephone Encounter (Signed)
Katherine Lawson called and is requesting a prescription of "Miracle Mouth wash" with lidocaine. I advised Dr T has never prescribed this medication. She states she takes it because it helps numb her throat and keeps her from chocking. She states it is Acid Reflux and the other medications are not helping. She states she is in between GI doctors and needs a refill.   Please advise.

## 2021-07-04 ENCOUNTER — Other Ambulatory Visit: Payer: Self-pay | Admitting: Sports Medicine

## 2021-07-06 ENCOUNTER — Other Ambulatory Visit: Payer: Self-pay

## 2021-07-06 ENCOUNTER — Ambulatory Visit (INDEPENDENT_AMBULATORY_CARE_PROVIDER_SITE_OTHER): Payer: Medicare Other | Admitting: Sports Medicine

## 2021-07-06 ENCOUNTER — Ambulatory Visit: Payer: Self-pay

## 2021-07-06 ENCOUNTER — Ambulatory Visit (INDEPENDENT_AMBULATORY_CARE_PROVIDER_SITE_OTHER): Payer: Medicare Other

## 2021-07-06 DIAGNOSIS — M170 Bilateral primary osteoarthritis of knee: Secondary | ICD-10-CM | POA: Diagnosis not present

## 2021-07-06 DIAGNOSIS — M7712 Lateral epicondylitis, left elbow: Secondary | ICD-10-CM

## 2021-07-06 NOTE — Assessment & Plan Note (Signed)
Increasing pain right knee patellofemoral, injected today due to insufficient improvement with Celebrex. Return to see me 1 month as needed.

## 2021-07-06 NOTE — Assessment & Plan Note (Signed)
Persistent discomfort in spite of months of conservative treatment, left common extensor tendon injection, return to see me in a month.

## 2021-07-06 NOTE — Progress Notes (Signed)
    Procedures performed today:    Procedure: Real-time Ultrasound Guided injection of the left common extensor tendon origin Device: Samsung HS60  Verbal informed consent obtained.  Time-out conducted.  Noted no overlying erythema, induration, or other signs of local infection.  Skin prepped in a sterile fashion.  Local anesthesia: Topical Ethyl chloride.  With sterile technique and under real time ultrasound guidance: Noted normal-appearing common extensor tendon, 1 cc kenalog 40, 1 cc lidocaine, 1 cc bupivacaine injected easily. Completed without difficulty  Advised to call if fevers/chills, erythema, induration, drainage, or persistent bleeding.  Images permanently stored and available for review in PACS.  Impression: Technically successful ultrasound guided injection.  Procedure: Real-time Ultrasound Guided injection of the right knee Device: Samsung HS60  Verbal informed consent obtained.  Time-out conducted.  Noted no overlying erythema, induration, or other signs of local infection.  Skin prepped in a sterile fashion.  Local anesthesia: Topical Ethyl chloride.  With sterile technique and under real time ultrasound guidance: Trace effusion noted, 1 cc Kenalog 40, 2 cc lidocaine, 2 cc bupivacaine injected easily Completed without difficulty  Advised to call if fevers/chills, erythema, induration, drainage, or persistent bleeding.  Images permanently stored and available for review in PACS.  Impression: Technically successful ultrasound guided injection.  Independent interpretation of notes and tests performed by another provider:   None.  Brief History, Exam, Impression, and Recommendations:    Osteoarthritis of both knees Increasing pain right knee patellofemoral, injected today due to insufficient improvement with Celebrex. Return to see me 1 month as needed.  Lateral epicondylitis, left elbow Persistent discomfort in spite of months of conservative treatment, left  common extensor tendon injection, return to see me in a month.    ___________________________________________ Ihor Austin. Benjamin Stain, M.D., ABFM., CAQSM. Primary Care and Sports Medicine [facility]  Adjunct Instructor of Family Medicine  [employer]

## 2021-07-13 ENCOUNTER — Telehealth: Payer: Self-pay

## 2021-07-13 NOTE — Telephone Encounter (Signed)
Patient called to report that since her shot in her knee her 2nd tow has been tingling. She didn't know if they are related but wanted to let you know and wants to hear something back from [place] either way. Please advise.

## 2021-07-13 NOTE — Telephone Encounter (Signed)
Patient aware of recommendations.  

## 2021-07-13 NOTE — Telephone Encounter (Signed)
Not likely related to the injection as we went and from the front, the nerves that innervate the toe are in the back of the knee, lets just watch this for the next few weeks.

## 2021-08-20 ENCOUNTER — Ambulatory Visit: Payer: BLUE CROSS/BLUE SHIELD | Admitting: Sports Medicine

## 2021-08-24 ENCOUNTER — Ambulatory Visit: Payer: BLUE CROSS/BLUE SHIELD | Admitting: Sports Medicine

## 2021-08-31 ENCOUNTER — Other Ambulatory Visit: Payer: Self-pay | Admitting: Sports Medicine

## 2021-08-31 DIAGNOSIS — I10 Essential (primary) hypertension: Secondary | ICD-10-CM

## 2021-09-07 ENCOUNTER — Other Ambulatory Visit: Payer: Self-pay | Admitting: Sports Medicine

## 2021-09-10 ENCOUNTER — Other Ambulatory Visit: Payer: Self-pay

## 2021-09-10 MED ORDER — MAGIC MOUTHWASH W/LIDOCAINE: 10.0000 mL | Freq: Four times a day (QID) | ORAL | 1 refills | Status: DC

## 2021-09-19 ENCOUNTER — Other Ambulatory Visit: Payer: Self-pay | Admitting: Sports Medicine

## 2021-09-19 DIAGNOSIS — I10 Essential (primary) hypertension: Secondary | ICD-10-CM

## 2021-10-15 ENCOUNTER — Ambulatory Visit: Payer: Medicare Other | Admitting: Sports Medicine

## 2021-10-15 ENCOUNTER — Other Ambulatory Visit: Payer: Self-pay

## 2021-10-15 DIAGNOSIS — F32A Depression, unspecified: Secondary | ICD-10-CM

## 2021-10-15 MED ORDER — ALPRAZOLAM 1 MG PO TABS: 1.0000 mg | ORAL_TABLET | Freq: Two times a day (BID) | ORAL | 3 refills | Status: DC | PRN

## 2021-11-02 ENCOUNTER — Ambulatory Visit (INDEPENDENT_AMBULATORY_CARE_PROVIDER_SITE_OTHER): Payer: Medicare Other | Admitting: Sports Medicine

## 2021-11-02 ENCOUNTER — Ambulatory Visit (INDEPENDENT_AMBULATORY_CARE_PROVIDER_SITE_OTHER): Payer: Medicare Other

## 2021-11-02 DIAGNOSIS — G43909 Migraine, unspecified, not intractable, without status migrainosus: Secondary | ICD-10-CM | POA: Insufficient documentation

## 2021-11-02 DIAGNOSIS — N644 Mastodynia: Secondary | ICD-10-CM | POA: Diagnosis not present

## 2021-11-02 DIAGNOSIS — M545 Low back pain, unspecified: Secondary | ICD-10-CM

## 2021-11-02 DIAGNOSIS — B081 Molluscum contagiosum: Secondary | ICD-10-CM

## 2021-11-02 DIAGNOSIS — M170 Bilateral primary osteoarthritis of knee: Secondary | ICD-10-CM | POA: Diagnosis not present

## 2021-11-02 DIAGNOSIS — K581 Irritable bowel syndrome with constipation: Secondary | ICD-10-CM | POA: Diagnosis not present

## 2021-11-02 DIAGNOSIS — M25572 Pain in left ankle and joints of left foot: Secondary | ICD-10-CM

## 2021-11-02 DIAGNOSIS — G43109 Migraine with aura, not intractable, without status migrainosus: Secondary | ICD-10-CM

## 2021-11-02 DIAGNOSIS — G8929 Other chronic pain: Secondary | ICD-10-CM

## 2021-11-02 MED ORDER — ACETAMINOPHEN ER 650 MG PO TBCR
650.0000 mg | EXTENDED_RELEASE_TABLET | Freq: Two times a day (BID) | ORAL | Status: AC
Start: 2021-11-02 — End: ?

## 2021-11-02 NOTE — Assessment & Plan Note (Addendum)
Patient does present with her boyfriend, she has been reporting increasing breast pain over the past several weeks, no discharge, mammogram was normal a few months ago. ?The are agreeable to do less nipple stimulation during relations (presents with her BF who was a Engineer, building services in [place], [place] and [place]). ?She is post breast augmentation, well-healed per her report. ?She is on hormone replacement. ?We will watch this for the next 4 to 6 weeks and if insufficient improvement I will do an exam. ?

## 2021-11-02 NOTE — Assessment & Plan Note (Signed)
Known bilateral knee osteoarthritis, right knee last injected in December 2022. ?Increasing pain with knees bent for prolonged periods of time, adding arthritis from Tylenol twice daily. ?If insufficient efficacy over the next 4 to 6 weeks we will inject her knees. ?

## 2021-11-02 NOTE — Assessment & Plan Note (Signed)
Axial low back pain, CT from last year did show DDD L5-S1 as well as lower lumbar facet arthritis. ?Adding core conditioning, return to see me in 6 weeks, MRI if no better. ?Arthritis strength Tylenol should also help. ?

## 2021-11-02 NOTE — Assessment & Plan Note (Signed)
Podofilox insufficiently efficacious, I think cryotherapy is the next step, I would like her to discuss this with dermatology. ?

## 2021-11-02 NOTE — Assessment & Plan Note (Signed)
Moderate pain left ankle, sinus tarsi. ?She does typically wear heels and dress shoes, I would like her to switch to an athletic appearance over the next week or 2, tennis shoes, yoga pants. ?We will get some x-rays today, return to see me in 4 to 6 weeks ?

## 2021-11-02 NOTE — Assessment & Plan Note (Signed)
This pleasant 66 year old female typically does well with Linzess, MiraLAX. ?She did have an episode of bright red blood per rectum that resolved spontaneously, suspect hemorrhoid, she is up-to-date on her screenings. ?If this recurs we will get more aggressive with treatment. ?I have advised gentle cleaning, as well as witch hazel/Tucks pads afterwards. ?

## 2021-11-02 NOTE — Assessment & Plan Note (Signed)
Also getting headaches, right frontal with photophobia, phonophobia, nausea, they can last for hours. ?She does get a scotomata type aura prior to her headache. ?She will diary these over the next month to 6 weeks, and she will use the arthritis from Tylenol as below. ?If more than 4-6 headache days in a month we will consider Topamax treatment. ?

## 2021-11-02 NOTE — Progress Notes (Signed)
? ? ?  Procedures performed today:   ? ?None. ? ?Independent interpretation of notes and tests performed by another provider:  ? ?None. ? ?Brief History, Exam, Impression, and Recommendations:   ? ?Irritable bowel syndrome with constipation ?This pleasant 66 year old female typically does well with Linzess, MiraLAX. ?She did have an episode of bright red blood per rectum that resolved spontaneously, suspect hemorrhoid, she is up-to-date on her screenings. ?If this recurs we will get more aggressive with treatment. ?I have advised gentle cleaning, as well as witch hazel/Tucks pads afterwards. ? ?Molluscum contagiosum ?Podofilox insufficiently efficacious, I think cryotherapy is the next step, I would like her to discuss this with dermatology. ? ?Osteoarthritis of both knees ?Known bilateral knee osteoarthritis, right knee last injected in December 2022. ?Increasing pain with knees bent for prolonged periods of time, adding arthritis from Tylenol twice daily. ?If insufficient efficacy over the next 4 to 6 weeks we will inject her knees. ? ?Breast pain ?Patient does present with her boyfriend, she has been reporting increasing breast pain over the past several weeks, no discharge, mammogram was normal a few months ago. ?The are agreeable to do less nipple stimulation during relations (presents with her BF who was a Pharmacist, hospital in [place], [place] and [employer]). ?She is post breast augmentation, well-healed per her report. ?She is on hormone replacement. ?We will watch this for the next 4 to 6 weeks and if insufficient improvement I will do an exam. ? ?Left ankle pain ?Moderate pain left ankle, sinus tarsi. ?She does typically wear heels and dress shoes, I would like her to switch to an athletic appearance over the next week or 2, tennis shoes, yoga pants. ?We will get some x-rays today, return to see me in 4 to 6 weeks ? ?Migraine headache ?Also getting headaches, right frontal with photophobia, phonophobia, nausea, they can  last for hours. ?She does get a scotomata type aura prior to her headache. ?She will diary these over the next month to 6 weeks, and she will use the arthritis from Tylenol as below. ?If more than 4-6 headache days in a month we will consider Topamax treatment. ? ?Low back pain ?Axial low back pain, CT from last year did show DDD L5-S1 as well as lower lumbar facet arthritis. ?Adding core conditioning, return to see me in 6 weeks, MRI if no better. ?Arthritis strength Tylenol should also help. ? ? ? ?___________________________________________ ?Ihor Austin. Benjamin Stain, M.D., ABFM., CAQSM. ?Primary Care and Sports Medicine ?[facility] ? ?Adjunct Instructor of Family Medicine  ?[employer] ?

## 2021-11-12 ENCOUNTER — Other Ambulatory Visit: Payer: Self-pay

## 2021-11-12 DIAGNOSIS — F419 Anxiety disorder, unspecified: Secondary | ICD-10-CM

## 2021-11-12 MED ORDER — ALPRAZOLAM 1 MG PO TABS: 1.0000 mg | ORAL_TABLET | Freq: Two times a day (BID) | ORAL | 3 refills | Status: DC | PRN

## 2021-11-12 NOTE — Telephone Encounter (Signed)
Patient called for a refill of her xanax. ?

## 2021-11-12 NOTE — Telephone Encounter (Signed)
Previous prescription was sent to [facility] and her insurance doesn't allow her to use this pharmacy.  ?

## 2021-12-15 ENCOUNTER — Ambulatory Visit: Payer: Medicare Other | Admitting: Sports Medicine

## 2021-12-18 ENCOUNTER — Encounter: Payer: Self-pay | Admitting: Sports Medicine

## 2021-12-18 ENCOUNTER — Ambulatory Visit (INDEPENDENT_AMBULATORY_CARE_PROVIDER_SITE_OTHER): Payer: Medicare Other | Admitting: Sports Medicine

## 2021-12-18 DIAGNOSIS — I10 Essential (primary) hypertension: Secondary | ICD-10-CM

## 2021-12-18 DIAGNOSIS — R635 Abnormal weight gain: Secondary | ICD-10-CM | POA: Diagnosis not present

## 2021-12-18 MED ORDER — VALSARTAN-HYDROCHLOROTHIAZIDE 80-12.5 MG PO TABS: 1.0000 | ORAL_TABLET | Freq: Every day | ORAL | 3 refills | Status: DC

## 2021-12-18 NOTE — Assessment & Plan Note (Signed)
Worsening hypertension on valsartan 80, she did even take another pill same day, BP still elevated. Mild headache. We will switch her from valsartan 80 valsartan/HCTZ 80/12.5. Nurse visit blood pressure check in 2 weeks with an increase to 160/25 if not better.

## 2021-12-18 NOTE — Assessment & Plan Note (Signed)
She was having some weight gain about 10 pounds over the past couple of months. Also being treated for constipation predominant IBS with Amitiza high-dose and Elavil by her gastroenterologist per Elavil is notorious for causing 5 to 10 pound weight gain so we will discontinue this, continue with Amitiza for now. Of note Linzess was not tolerated.

## 2021-12-18 NOTE — Progress Notes (Signed)
    Procedures performed today:    None.  Independent interpretation of notes and tests performed by another provider:   None.  Brief History, Exam, Impression, and Recommendations:    Benign essential hypertension Worsening hypertension on valsartan 80, she did even take another pill same day, BP still elevated. Mild headache. We will switch her from valsartan 80 valsartan/HCTZ 80/12.5. Nurse visit blood pressure check in 2 weeks with an increase to 160/25 if not better.  Abnormal weight gain She was having some weight gain about 10 pounds over the past couple of months. Also being treated for constipation predominant IBS with Amitiza high-dose and Elavil by her gastroenterologist per Elavil is notorious for causing 5 to 10 pound weight gain so we will discontinue this, continue with Amitiza for now. Of note Linzess was not tolerated.  Chronic process with exacerbation and pharmacologic intervention  ___________________________________________ Ihor Austin. Benjamin Stain, M.D., ABFM., CAQSM. Primary Care and Sports Medicine [facility]  Adjunct Instructor of Family Medicine  [employer]

## 2021-12-23 ENCOUNTER — Telehealth: Payer: Self-pay

## 2021-12-23 NOTE — Telephone Encounter (Signed)
FYI - Patient called to let us know that she was having some testing done today and her BP was bouncing around between 110 and 160. They told her to let her PCP know.

## 2021-12-24 NOTE — Telephone Encounter (Signed)
Thank you for letting us know, if she has persistent resting blood pressures over 140/90 we will intervene.

## 2021-12-25 NOTE — Telephone Encounter (Signed)
Patient aware of recommendations regarding BP. She stated that she gained 4 lb in one day. She did state that there is some slight swelling in her feet and ankles. She stated taht on her scales she weighted 160 lb today.

## 2022-01-01 ENCOUNTER — Ambulatory Visit: Payer: Medicare Other | Admitting: Sports Medicine

## 2022-01-01 ENCOUNTER — Ambulatory Visit: Payer: Medicare Other

## 2022-01-12 ENCOUNTER — Telehealth: Payer: Self-pay

## 2022-01-12 NOTE — Telephone Encounter (Signed)
Not too uncommon for blood pressure to fluctuate like that, lets just watch it for now, no changes.  She should for the most part stop checking her blood pressures at home because she will get neurotic about it.  Lets have her do a nurse visit blood pressure.

## 2022-01-12 NOTE — Telephone Encounter (Signed)
Patient called to report that her BP is jumping from 105 to 155 in a matter of hours. She is symptomatic with these changes. She also states that the medication is not helping with the edema. Please advise.

## 2022-01-13 NOTE — Telephone Encounter (Signed)
Patient stated that her boyfriend made her call yesterday. She is ok with not coming in for a nurse visit. She did report having edema to the point of having to wear flip flops and bloating. She is scheduled for a EGD and colonoscopy in July.

## 2022-01-28 ENCOUNTER — Other Ambulatory Visit: Payer: Self-pay

## 2022-01-28 DIAGNOSIS — F419 Anxiety disorder, unspecified: Secondary | ICD-10-CM

## 2022-01-28 NOTE — Telephone Encounter (Signed)
Patient left her meds out of town and [facility] can fill all meds except Alprazolam

## 2022-01-29 MED ORDER — ALPRAZOLAM 1 MG PO TABS: 1.0000 mg | ORAL_TABLET | Freq: Two times a day (BID) | ORAL | 3 refills | Status: DC | PRN

## 2022-03-10 ENCOUNTER — Other Ambulatory Visit: Payer: Self-pay | Admitting: Sports Medicine

## 2022-03-10 DIAGNOSIS — I10 Essential (primary) hypertension: Secondary | ICD-10-CM

## 2022-03-10 DIAGNOSIS — F419 Anxiety disorder, unspecified: Secondary | ICD-10-CM

## 2022-03-17 ENCOUNTER — Encounter: Payer: Self-pay | Admitting: General Practice

## 2022-03-23 HISTORY — PX: LAPAROSCOPIC BILATERAL SALPINGO OOPHERECTOMY: SHX5890

## 2022-04-02 ENCOUNTER — Telehealth: Payer: Self-pay

## 2022-04-02 NOTE — Telephone Encounter (Signed)
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 She is also holding a lot of water weight. She just wanted to make you aware so when she comes for her appt next week, you can discuss.

## 2022-04-05 ENCOUNTER — Ambulatory Visit (INDEPENDENT_AMBULATORY_CARE_PROVIDER_SITE_OTHER): Payer: Medicare Other | Admitting: Sports Medicine

## 2022-04-05 DIAGNOSIS — Z780 Asymptomatic menopausal state: Secondary | ICD-10-CM

## 2022-04-05 DIAGNOSIS — Z0000 Encounter for general adult medical examination without abnormal findings: Secondary | ICD-10-CM

## 2022-04-05 NOTE — Progress Notes (Signed)
MEDICARE ANNUAL WELLNESS VISIT  04/05/2022  Telephone Visit Disclaimer This Medicare AWV was conducted by telephone due to national recommendations for restrictions regarding the COVID-19 Pandemic (e.g. social distancing).  I verified, using two identifiers, that I am speaking with Katherine Lawson or their authorized healthcare agent. I discussed the limitations, risks, security, and privacy concerns of performing an evaluation and management service by telephone and the potential availability of an in-person appointment in the future. The patient expressed understanding and agreed to proceed.  Location of Patient: Home Location of Provider (nurse):  In the office.  Subjective:    Katherine Lawson is a 66 y.o. female patient of Thekkekandam, Ihor Austin, MD who had a Medicare Annual Wellness Visit today via telephone. Katherine Lawson is Retired and lives with her son and grandchildren. she has 2 children and one has passed away. she reports that she is socially active and does interact with friends/family regularly. she is minimally physically active and enjoys gardening.  Patient Care Team: Monica Becton, MD as PCP - General (Family Medicine)     04/05/2022    1:21 PM 10/16/2015   11:13 AM  Advanced Directives  Does Patient Have a Medical Advance Directive? Yes Yes  Type of Advance Directive Living will   Does patient want to make changes to medical advance directive? No - Patient declined   Copy of Healthcare Power of Attorney in Chart?  No - copy requested    Hospital Utilization Over the Past 12 Months: # of hospitalizations or ER visits: 1 # of surgeries: 1  Review of Systems    Patient reports that her overall health is unchanged compared to last year.  History obtained from chart review and the patient  Patient Reported Readings (BP, Pulse, CBG, Weight, etc) none  Pain Assessment Pain : 0-10 Pain Score: 6  Pain Type: Other (Comment) (post surgical- hysterectomy) Pain  Location: Abdomen Pain Orientation: Left, Lower Pain Descriptors / Indicators: Aching, Sharp Pain Onset: 1 to 4 weeks ago Pain Frequency: Constant Pain Relieving Factors: rest  Pain Relieving Factors: rest  Current Medications & Allergies (verified) Allergies as of 04/05/2022       Reactions   Codeine         Medication List        Accurate as of April 05, 2022  1:51 PM. If you have any questions, ask your nurse or doctor.          acetaminophen 650 MG CR tablet Commonly known as: TYLENOL Take 1 tablet (650 mg total) by mouth in the morning and at bedtime.   acetaminophen-caffeine 500-65 MG Tabs per tablet Commonly known as: EXCEDRIN TENSION HEADACHE Take 2 tablets by mouth every 6 hours for up to 3 days as needed for headache. Max of 6 tablets per 24 hours. Not for pregnancy.   ALPRAZolam 1 MG tablet Commonly known as: XANAX Take by mouth.   ALPRAZolam 1 MG tablet Commonly known as: XANAX TAKE 1 TABLET BY MOUTH 2 TIMES DAILY AS NEEDED FOR ANXIETY.   ammonium lactate 12 % lotion Commonly known as: LAC-HYDRIN Apply topically 2 (two) times daily.   cyclobenzaprine 7.5 MG tablet Commonly known as: FEXMID Take 7.5 mg by mouth 3 (three) times daily.   dexlansoprazole 60 MG capsule Commonly known as: DEXILANT Take 1 capsule by mouth 2 (two) times daily.   dicyclomine 10 MG capsule Commonly known as: BENTYL Take 10 mg by mouth 3 (three) times daily as needed.   dicyclomine  20 MG tablet Commonly known as: BENTYL TAKE 1 TABLET BY MOUTH EVERY 6 HOURS.   diphenhydramine-acetaminophen 25-500 MG Tabs tablet Commonly known as: TYLENOL PM Take 1 tablet by mouth at bedtime as needed.   estradiol 2 MG tablet Commonly known as: ESTRACE Take 2 mg by mouth daily.   ibuprofen 800 MG tablet Commonly known as: ADVIL Take 800 mg by mouth every 8 (eight) hours as needed.   lactulose 10 GM/15ML solution Commonly known as: CHRONULAC Take 20 g by mouth 3 (three)  times daily.   lubiprostone 24 MCG capsule Commonly known as: AMITIZA Take by mouth.   magic mouthwash w/lidocaine Soln Take 10 mLs by mouth 4 (four) times daily. Swish for 2 minutes then spit out.   oxyCODONE 5 MG immediate release tablet Commonly known as: Oxy IR/ROXICODONE Take 5 mg by mouth every 4 (four) hours as needed.   podofilox 0.5 % gel Commonly known as: CONDYLOX Apply topically every 12 hours in the morning and evening for 3 days, then withhold for 4 days; repeat cycle up to 4 times   Premarin vaginal cream Generic drug: conjugated estrogens Place vaginally at bedtime.   progesterone 100 MG capsule Commonly known as: PROMETRIUM Take 100-200 mg by mouth at bedtime.   promethazine 12.5 MG tablet Commonly known as: PHENERGAN Take 12.5 mg by mouth every 6 (six) hours as needed.   topiramate 100 MG tablet Commonly known as: TOPAMAX Take by mouth.   valsartan-hydrochlorothiazide 80-12.5 MG tablet Commonly known as: DIOVAN-HCT TAKE 1 TABLET BY MOUTH EVERY DAY        History (reviewed): Past Medical History:  Diagnosis Date   Anxiety    Arthritis    Barrett's esophagus    Hiatal hernia    Hx of breast implants, bilateral 1990   S/P partial hysterectomy 1980   Past Surgical History:  Procedure Laterality Date   ABDOMINAL HYSTERECTOMY     BREAST ENHANCEMENT SURGERY  07/27/1987   CHOLECYSTECTOMY     LAPAROSCOPIC BILATERAL SALPINGO OOPHERECTOMY Bilateral 03/23/2022   NOSE SURGERY     sculpting     Thermal sculpting of abdominal adipose tissue   WRIST SURGERY     Family History  Problem Relation Age of Onset   Cancer Mother        melanoma   Cancer Father        melonoma,bone   Diabetes Mother    Diabetes Father    Cancer Maternal Grandmother        breast   Social History   Socioeconomic History   Marital status: Widowed    Spouse name: Not on file   Number of children: 2   Years of education: 32   Highest education level: 12th grade   Occupational History   Occupation: Agricultural consultant    Comment: cancer center   Occupation: self employed  Tobacco Use   Smoking status: Never   Smokeless tobacco: Never  Vaping Use   Vaping Use: Never used  Substance and Sexual Activity   Alcohol use: Not Currently    Comment: No drink since June, 2023   Drug use: No   Sexual activity: Yes    Partners: Male  Other Topics Concern   Not on file  Social History Narrative   Lives with her son and her grandchildren. She enjoys gardening and arts and crafts.   Social Determinants of Health   Financial Resource Strain: Low Risk  (04/05/2022)   Overall Financial Resource Strain (CARDIA)  Difficulty of Paying Living Expenses: Not hard at all  Food Insecurity: No Food Insecurity (04/05/2022)   Hunger Vital Sign    Worried About Running Out of Food in the Last Year: Never true    Ran Out of Food in the Last Year: Never true  Transportation Needs: No Transportation Needs (04/05/2022)   PRAPARE - Hydrologist (Medical): No    Lack of Transportation (Non-Medical): No  Physical Activity: Sufficiently Active (04/05/2022)   Exercise Vital Sign    Days of Exercise per Week: 6 days    Minutes of Exercise per Session: 60 min  Stress: No Stress Concern Present (04/05/2022)   [place]    Feeling of Stress : Not at all  Social Connections: Moderately Isolated (04/05/2022)   Social Connection and Isolation Panel [NHANES]    Frequency of Communication with Friends and Family: More than three times a week    Frequency of Social Gatherings with Friends and Family: More than three times a week    Attends Religious Services: Never    Marine scientist or Organizations: Yes    Attends Music therapist: More than 4 times per year    Marital Status: Widowed    Activities of Daily Living    04/05/2022    1:35 PM  In your present state of  health, do you have any difficulty performing the following activities:  Hearing? 0  Vision? 0  Difficulty concentrating or making decisions? 1  Comment some memory issues.  Walking or climbing stairs? 0  Dressing or bathing? 0  Doing errands, shopping? 0  Preparing Food and eating ? N  Using the Toilet? N  In the past six months, have you accidently leaked urine? N  Do you have problems with loss of bowel control? N  Managing your Medications? N  Managing your Finances? N  Housekeeping or managing your Housekeeping? N    Patient Education/ Literacy How often do you need to have someone help you when you read instructions, pamphlets, or other written materials from your doctor or pharmacy?: 1 - Never What is the last grade level you completed in school?: 12th grade  Exercise Current Exercise Habits: Home exercise routine, Type of exercise: Other - see comments;strength training/weights (ellipitical, bike ([place])), Time (Minutes): 60, Frequency (Times/Week): 4, Weekly Exercise (Minutes/Week): 240, Intensity: Moderate, Exercise limited by: None identified  Diet Patient reports consuming 2 meals a day and 1 snack(s) a day Patient reports that her primary diet is: Regular Patient reports that she does have regular access to food.   Depression Screen    04/05/2022    1:21 PM 10/03/2018   11:25 AM 06/09/2017    9:57 AM  PHQ 2/9 Scores  PHQ - 2 Score 2 0 4  PHQ- 9 Score 6 8 18      Fall Risk    04/05/2022    1:21 PM  Fall Risk   Falls in the past year? 1  Number falls in past yr: 0  Injury with Fall? 0  Risk for fall due to : No Fall Risks  Follow up Falls evaluation completed     Objective:  Katherine Lawson seemed alert and oriented and she participated appropriately during our telephone visit.  Blood Pressure Weight BMI  BP Readings from Last 3 Encounters:  12/18/21 (!) 150/76  03/23/21 (!) 152/86  11/24/20 (!) 145/76   Wt Readings from Last 3  Encounters:  12/18/21  156 lb (70.8 kg)  03/23/21 143 lb 1.3 oz (64.9 kg)  11/24/20 142 lb (64.4 kg)   BMI Readings from Last 1 Encounters:  12/18/21 29.48 kg/Katherine Lawson    *Unable to obtain current vital signs, weight, and BMI due to telephone visit type  Hearing/Vision  Katherine Lawson did not seem to have difficulty with hearing/understanding during the telephone conversation Reports that she has not had a formal eye exam by an eye care professional within the past year Reports that she has not had a formal hearing evaluation within the past year *Unable to fully assess hearing and vision during telephone visit type  Cognitive Function:    04/05/2022    1:42 PM  6CIT Screen  What Year? 0 points  What month? 0 points  What time? 0 points  Count back from 20 0 points  Months in reverse 0 points  Repeat phrase 0 points  Total Score 0 points   (Normal:0-7, Significant for Dysfunction: >8)  Normal Cognitive Function Screening: Yes   Immunization & Health Maintenance Record Immunization History  Administered Date(s) Administered   Influenza Inj Mdck Quad Pf 06/09/2018   Influenza,inj,Quad PF,6+ Mos 06/11/2014   Influenza-Unspecified 05/13/2011, 05/17/2013, 05/27/2015   PFIZER(Purple Top)SARS-COV-2 Vaccination 10/03/2019, 10/31/2019   Tdap 07/20/2016   Zoster, Live 07/20/2016    Health Maintenance  Topic Date Due   COVID-19 Vaccine (3 - Pfizer series) 04/21/2022 (Originally 12/26/2019)   Zoster Vaccines- Shingrix (1 of 2) 07/05/2022 (Originally 03/12/2006)   INFLUENZA VACCINE  10/24/2022 (Originally 02/23/2022)   Pneumonia Vaccine 60+ Years old (1 - PCV) 04/06/2023 (Originally 03/12/2021)   DEXA SCAN  04/06/2023 (Originally 03/12/2021)   MAMMOGRAM  05/15/2023   TETANUS/TDAP  07/20/2026   COLONOSCOPY (Pts 45-47yrs Insurance coverage will need to be confirmed)  08/16/2029   Hepatitis C Screening  Completed   HPV VACCINES  Aged Out       Assessment  This is a routine wellness examination for Katherine Lawson.  Health Maintenance: Due or Overdue There are no preventive care reminders to display for this patient.   Katherine Lawson does not need a referral for Community Assistance: Care Management:   no Social Work:    no Prescription Assistance:  no Nutrition/Diabetes Education:  no   Plan:  Personalized Goals  Goals Addressed               This Visit's Progress     Patient Stated (pt-stated)        Patient would like to loose 15 lbs.       Personalized Health Maintenance & Screening Recommendations  Pneumococcal vaccine  Influenza vaccine Bone densitometry screening Shingrix vaccine  Lung Cancer Screening Recommended: no (Low Dose CT Chest recommended if Age 97-80 years, 30 pack-year currently smoking OR have quit w/in past 15 years) Hepatitis C Screening recommended: no HIV Screening recommended: no  Advanced Directives: Written information was not prepared per patient's request.  Referrals & Orders Orders Placed This Encounter  Procedures   Katherine Lawson    Follow-up Plan Follow-up with Silverio Decamp, MD as planned Schedule Shingrix vaccine at the pharmacy. Bring immunization record from pharmacy regarding pneumonia vaccine. Medicare wellness visit in one year. Patient will access AVS on my chart   I have personally reviewed and noted the following in the patient's chart:   Medical and social history Use of alcohol, tobacco or illicit drugs  Current medications and supplements Functional ability and status Nutritional status Physical activity Advanced  directives List of other physicians Hospitalizations, surgeries, and ER visits in previous 12 months Vitals Screenings to include cognitive, depression, and falls Referrals and appointments  In addition, I have reviewed and discussed with Katherine Lawson certain preventive protocols, quality metrics, and best practice recommendations. A written personalized care plan for preventive services as well  as general preventive health recommendations is available and can be mailed to the patient at her request.      Tinnie Gens, RN BSN  04/05/2022

## 2022-04-05 NOTE — Patient Instructions (Addendum)
MEDICARE ANNUAL WELLNESS VISIT Health Maintenance Summary and Written Plan of Care  Ms. Katherine Lawson ,  Thank you for allowing me to perform your Medicare Annual Wellness Visit and for your ongoing commitment to your health.   Health Maintenance & Immunization History Health Maintenance  Topic Date Due   COVID-19 Vaccine (3 - Pfizer series) 04/21/2022 (Originally 12/26/2019)   Zoster Vaccines- Shingrix (1 of 2) 07/05/2022 (Originally 03/12/2006)   INFLUENZA VACCINE  10/24/2022 (Originally 02/23/2022)   Pneumonia Vaccine 17+ Years old (1 - PCV) 04/06/2023 (Originally 03/12/2021)   DEXA SCAN  04/06/2023 (Originally 03/12/2021)   MAMMOGRAM  05/15/2023   TETANUS/TDAP  07/20/2026   COLONOSCOPY (Pts 45-46yrs Insurance coverage will need to be confirmed)  08/16/2029   Hepatitis C Screening  Completed   HPV VACCINES  Aged Out   Immunization History  Administered Date(s) Administered   Influenza Inj Mdck Quad Pf 06/09/2018   Influenza,inj,Quad PF,6+ Mos 06/11/2014   Influenza-Unspecified 05/13/2011, 05/17/2013, 05/27/2015   PFIZER(Purple Top)SARS-COV-2 Vaccination 10/03/2019, 10/31/2019   Tdap 07/20/2016   Zoster, Live 07/20/2016    These are the patient goals that we discussed:  Goals Addressed               This Visit's Progress     Patient Stated (pt-stated)        Patient would like to loose 15 lbs.         This is a list of Health Maintenance Items that are overdue or due now: Pneumococcal vaccine  Influenza vaccine Bone densitometry screening Shingrix vaccine  Orders/Referrals Placed Today: No orders of the defined types were placed in this encounter.  (Contact our referral department at [phone] if you have not spoken with someone about your referral appointment within the next 5 days)    Follow-up Plan Follow-up with Monica Becton, MD as planned Schedule Shingrix vaccine at the pharmacy. Bring immunization record from pharmacy regarding pneumonia  vaccine. Medicare wellness visit in one year. Patient will access AVS on my chart      Health Maintenance, Female Adopting a healthy lifestyle and getting preventive care are important in promoting health and wellness. Ask your health care provider about: The right schedule for you to have regular tests and exams. Things you can do on your own to prevent diseases and keep yourself healthy. What should I know about diet, weight, and exercise? Eat a healthy diet  Eat a diet that includes plenty of vegetables, fruits, low-fat dairy products, and lean protein. Do not eat a lot of foods that are high in solid fats, added sugars, or sodium. Maintain a healthy weight Body mass index (BMI) is used to identify weight problems. It estimates body fat based on height and weight. Your health care provider can help determine your BMI and help you achieve or maintain a healthy weight. Get regular exercise Get regular exercise. This is one of the most important things you can do for your health. Most adults should: Exercise for at least 150 minutes each week. The exercise should increase your heart rate and make you sweat (moderate-intensity exercise). Do strengthening exercises at least twice a week. This is in addition to the moderate-intensity exercise. Spend less time sitting. Even light physical activity can be beneficial. Watch cholesterol and blood lipids Have your blood tested for lipids and cholesterol at 66 years of age, then have this test every 5 years. Have your cholesterol levels checked more often if: Your lipid or cholesterol levels are high. You are  older than 66 years of age. You are at high risk for heart disease. What should I know about cancer screening? Depending on your health history and family history, you may need to have cancer screening at various ages. This may include screening for: Breast cancer. Cervical cancer. Colorectal cancer. Skin cancer. Lung cancer. What  should I know about heart disease, diabetes, and high blood pressure? Blood pressure and heart disease High blood pressure causes heart disease and increases the risk of stroke. This is more likely to develop in people who have high blood pressure readings or are overweight. Have your blood pressure checked: Every 3-5 years if you are 2-61 years of age. Every year if you are 97 years old or older. Diabetes Have regular diabetes screenings. This checks your fasting blood sugar level. Have the screening done: Once every three years after age 64 if you are at a normal weight and have a low risk for diabetes. More often and at a younger age if you are overweight or have a high risk for diabetes. What should I know about preventing infection? Hepatitis B If you have a higher risk for hepatitis B, you should be screened for this virus. Talk with your health care provider to find out if you are at risk for hepatitis B infection. Hepatitis C Testing is recommended for: Everyone born from [birth year] through 1965. Anyone with known risk factors for hepatitis C. Sexually transmitted infections (STIs) Get screened for STIs, including gonorrhea and chlamydia, if: You are sexually active and are younger than 66 years of age. You are older than 66 years of age and your health care provider tells you that you are at risk for this type of infection. Your sexual activity has changed since you were last screened, and you are at increased risk for chlamydia or gonorrhea. Ask your health care provider if you are at risk. Ask your health care provider about whether you are at high risk for HIV. Your health care provider may recommend a prescription medicine to help prevent HIV infection. If you choose to take medicine to prevent HIV, you should first get tested for HIV. You should then be tested every 3 months for as long as you are taking the medicine. Pregnancy If you are about to stop having your period  (premenopausal) and you may become pregnant, seek counseling before you get pregnant. Take 400 to 800 micrograms (mcg) of folic acid every day if you become pregnant. Ask for birth control (contraception) if you want to prevent pregnancy. Osteoporosis and menopause Osteoporosis is a disease in which the bones lose minerals and strength with aging. This can result in bone fractures. If you are 28 years old or older, or if you are at risk for osteoporosis and fractures, ask your health care provider if you should: Be screened for bone loss. Take a calcium or vitamin D supplement to lower your risk of fractures. Be given hormone replacement therapy (HRT) to treat symptoms of menopause. Follow these instructions at home: Alcohol use Do not drink alcohol if: Your health care provider tells you not to drink. You are pregnant, may be pregnant, or are planning to become pregnant. If you drink alcohol: Limit how much you have to: 0-1 drink a day. Know how much alcohol is in your drink. In the [place] one drink equals one 12 oz bottle of beer (355 mL), one 5 oz glass of wine (148 mL), or one 1 oz glass of hard liquor (44 mL). Lifestyle  Do not use any products that contain nicotine or tobacco. These products include cigarettes, chewing tobacco, and vaping devices, such as e-cigarettes. If you need help quitting, ask your health care provider. Do not use street drugs. Do not share needles. Ask your health care provider for help if you need support or information about quitting drugs. General instructions Schedule regular health, dental, and eye exams. Stay current with your vaccines. Tell your health care provider if: You often feel depressed. You have ever been abused or do not feel safe at home. Summary Adopting a healthy lifestyle and getting preventive care are important in promoting health and wellness. Follow your health care provider's instructions about healthy diet, exercising, and getting  tested or screened for diseases. Follow your health care provider's instructions on monitoring your cholesterol and blood pressure. This information is not intended to replace advice given to you by your health care provider. Make sure you discuss any questions you have with your health care provider. Document Revised: 12/01/2020 Document Reviewed: 12/01/2020 Elsevier Patient Education  2023 [employer].

## 2022-04-06 ENCOUNTER — Ambulatory Visit: Payer: Medicare Other | Admitting: Sports Medicine

## 2022-04-14 ENCOUNTER — Ambulatory Visit: Payer: Medicare Other | Admitting: Sports Medicine

## 2022-06-06 ENCOUNTER — Other Ambulatory Visit: Payer: Self-pay | Admitting: Sports Medicine

## 2022-06-06 DIAGNOSIS — K581 Irritable bowel syndrome with constipation: Secondary | ICD-10-CM

## 2022-07-12 ENCOUNTER — Other Ambulatory Visit: Payer: Self-pay | Admitting: Sports Medicine

## 2022-07-12 DIAGNOSIS — F419 Anxiety disorder, unspecified: Secondary | ICD-10-CM

## 2022-07-13 ENCOUNTER — Telehealth: Payer: Self-pay

## 2022-07-13 NOTE — Telephone Encounter (Signed)
error 

## 2022-08-19 LAB — HM MAMMOGRAPHY

## 2022-09-02 ENCOUNTER — Telehealth (INDEPENDENT_AMBULATORY_CARE_PROVIDER_SITE_OTHER): Payer: Medicare Other | Admitting: Sports Medicine

## 2022-09-02 DIAGNOSIS — E782 Mixed hyperlipidemia: Secondary | ICD-10-CM

## 2022-09-02 DIAGNOSIS — F418 Other specified anxiety disorders: Secondary | ICD-10-CM

## 2022-09-02 DIAGNOSIS — F32A Depression, unspecified: Secondary | ICD-10-CM

## 2022-09-02 DIAGNOSIS — Z0000 Encounter for general adult medical examination without abnormal findings: Secondary | ICD-10-CM

## 2022-09-02 DIAGNOSIS — I10 Essential (primary) hypertension: Secondary | ICD-10-CM

## 2022-09-02 DIAGNOSIS — F419 Anxiety disorder, unspecified: Secondary | ICD-10-CM | POA: Diagnosis not present

## 2022-09-02 MED ORDER — VALSARTAN-HYDROCHLOROTHIAZIDE 320-25 MG PO TABS: 1.0000 | ORAL_TABLET | Freq: Every day | ORAL | 3 refills | Status: DC

## 2022-09-02 MED ORDER — ROSUVASTATIN CALCIUM 10 MG PO TABS: 10.0000 mg | ORAL_TABLET | Freq: Every day | ORAL | 3 refills | Status: DC

## 2022-09-02 MED ORDER — ALPRAZOLAM 2 MG PO TABS: 2.0000 mg | ORAL_TABLET | Freq: Two times a day (BID) | ORAL | 0 refills | Status: DC | PRN

## 2022-09-02 NOTE — Assessment & Plan Note (Signed)
As below son had a myocardial infarction, she has been uncontrolled on valsartan 80/hydrochlorothiazide 12.5, we will increase her to valsartan/HCTZ 160/25 with a 2-week blood pressure recheck.

## 2022-09-02 NOTE — Progress Notes (Signed)
   Virtual Visit via Telephone   I connected with  Katherine Lawson  on 09/02/22 by telephone/telehealth and verified that I am speaking with the correct person using two identifiers.   I discussed the limitations, risks, security and privacy concerns of performing an evaluation and management service by telephone, including the higher likelihood of inaccurate diagnosis and treatment, and the availability of in person appointments.  We also discussed the likely need of an additional face to face encounter for complete and high quality delivery of care.  I also discussed with the patient that there may be a patient responsible charge related to this service. The patient expressed understanding and wishes to proceed.  Provider location is in medical facility. Patient location is at their home, different from provider location. People involved in care of the patient during this telehealth encounter were myself, my nurse/medical assistant, and my front office/scheduling team member.  Review of Systems: No fevers, chills, night sweats, weight loss, chest pain, or shortness of breath.   Objective Findings:    General: Speaking full sentences, no audible heavy breathing.  Sounds alert and appropriately interactive.    Independent interpretation of tests performed by another provider:   None.  Brief History, Exam, Impression, and Recommendations:    Mixed hyperlipidemia This pleasant 67 year old female has been off of her cholesterol medication, her son recently had a heart attack necessitating quadruple bypass, she is interested in getting back aggressive with her risk factors. Her last lipid panel showed an LDL just above 952, we can certainly improve her numbers and decrease her risk by adding a low-dose Crestor. Adding Crestor, we can recheck fasting lipids in 3 months.  Benign essential hypertension As below son had a myocardial infarction, she has been uncontrolled on valsartan  80/hydrochlorothiazide 12.5, we will increase her to valsartan/HCTZ 160/25 with a 2-week blood pressure recheck.  Anxiety and depression Increasing anxiety and panic after her son had a heart attack and quadruple bypass, increasing alprazolam to 2 mg to be used once or twice a day. In a few months we should drop her back down to 1 mg.   I discussed the above assessment and treatment plan with the patient. The patient was provided an opportunity to ask questions and all were answered. The patient agreed with the plan and demonstrated an understanding of the instructions.   The patient was advised to call back or seek an in-person evaluation if the symptoms worsen or if the condition fails to improve as anticipated.   I provided 30 minutes of verbal and non-verbal time during this encounter date, time was needed to gather information, review chart, records, communicate/coordinate with staff remotely, as well as complete documentation.   ____________________________________________ Gwen Her. Dianah Field, M.D., ABFM., CAQSM., AME. Primary Care and Sports Medicine [facility]  Adjunct Professor of [place] [employer] [facility] [employer] [facility] [employer] of Medicine  Risk manager

## 2022-09-02 NOTE — Assessment & Plan Note (Signed)
This pleasant 67 year old female has been off of her cholesterol medication, her son recently had a heart attack necessitating quadruple bypass, she is interested in getting back aggressive with her risk factors. Her last lipid panel showed an LDL just above 161, we can certainly improve her numbers and decrease her risk by adding a low-dose Crestor. Adding Crestor, we can recheck fasting lipids in 3 months.

## 2022-09-02 NOTE — Assessment & Plan Note (Signed)
Increasing anxiety and panic after her son had a heart attack and quadruple bypass, increasing alprazolam to 2 mg to be used once or twice a day. In a few months we should drop her back down to 1 mg.

## 2022-10-18 ENCOUNTER — Telehealth: Payer: Self-pay

## 2022-10-18 DIAGNOSIS — F32A Depression, unspecified: Secondary | ICD-10-CM

## 2022-10-18 MED ORDER — ALPRAZOLAM 2 MG PO TABS: 2.0000 mg | ORAL_TABLET | Freq: Two times a day (BID) | ORAL | 0 refills | Status: DC | PRN

## 2022-10-18 NOTE — Telephone Encounter (Signed)
Done

## 2022-10-18 NOTE — Telephone Encounter (Signed)
Patient needs refill on xanax please send in.

## 2022-10-18 NOTE — Addendum Note (Signed)
Addended by: Silverio Decamp on: 10/18/2022 05:09 PM   Modules accepted: Orders

## 2022-11-05 ENCOUNTER — Other Ambulatory Visit: Payer: Self-pay | Admitting: Sports Medicine

## 2022-11-05 DIAGNOSIS — I10 Essential (primary) hypertension: Secondary | ICD-10-CM

## 2022-11-11 ENCOUNTER — Ambulatory Visit: Payer: Medicare Other | Admitting: Sports Medicine

## 2022-11-26 ENCOUNTER — Encounter: Payer: Self-pay | Admitting: Sports Medicine

## 2022-11-26 ENCOUNTER — Ambulatory Visit (INDEPENDENT_AMBULATORY_CARE_PROVIDER_SITE_OTHER): Payer: Medicare Other | Admitting: Sports Medicine

## 2022-11-26 VITALS — BP 142/84 | HR 86

## 2022-11-26 DIAGNOSIS — F419 Anxiety disorder, unspecified: Secondary | ICD-10-CM

## 2022-11-26 DIAGNOSIS — G43109 Migraine with aura, not intractable, without status migrainosus: Secondary | ICD-10-CM

## 2022-11-26 DIAGNOSIS — R635 Abnormal weight gain: Secondary | ICD-10-CM

## 2022-11-26 DIAGNOSIS — N952 Postmenopausal atrophic vaginitis: Secondary | ICD-10-CM

## 2022-11-26 DIAGNOSIS — E559 Vitamin D deficiency, unspecified: Secondary | ICD-10-CM | POA: Diagnosis not present

## 2022-11-26 DIAGNOSIS — R739 Hyperglycemia, unspecified: Secondary | ICD-10-CM

## 2022-11-26 DIAGNOSIS — E538 Deficiency of other specified B group vitamins: Secondary | ICD-10-CM | POA: Diagnosis not present

## 2022-11-26 DIAGNOSIS — E782 Mixed hyperlipidemia: Secondary | ICD-10-CM

## 2022-11-26 DIAGNOSIS — F32A Depression, unspecified: Secondary | ICD-10-CM

## 2022-11-26 LAB — CBC: HCT: 40.1 % (ref 35.0–45.0)

## 2022-11-26 MED ORDER — ALPRAZOLAM 2 MG PO TABS
2.0000 mg | ORAL_TABLET | Freq: Two times a day (BID) | ORAL | 0 refills | Status: DC | PRN
Start: 2022-11-26 — End: 2022-12-31

## 2022-11-26 MED ORDER — ESCITALOPRAM OXALATE 10 MG PO TABS: ORAL_TABLET | ORAL | 3 refills | Status: DC

## 2022-11-26 MED ORDER — TOPIRAMATE 50 MG PO TABS
ORAL_TABLET | ORAL | 3 refills | Status: AC
Start: 2022-11-26 — End: ?

## 2022-11-26 MED ORDER — ESTRADIOL 0.1 MG/GM VA CREA
1.0000 | TOPICAL_CREAM | Freq: Every day | VAGINAL | 3 refills | Status: AC
Start: 2022-11-26 — End: ?

## 2022-11-26 MED ORDER — TOPIRAMATE 50 MG PO TABS
ORAL_TABLET | ORAL | 3 refills | Status: DC
Start: 2022-11-26 — End: 2022-11-26

## 2022-11-26 MED ORDER — ALPRAZOLAM 2 MG PO TABS: 2.0000 mg | ORAL_TABLET | Freq: Two times a day (BID) | ORAL | 0 refills | Status: DC | PRN

## 2022-11-26 MED ORDER — ESCITALOPRAM OXALATE 10 MG PO TABS
ORAL_TABLET | ORAL | 3 refills | Status: DC
Start: 2022-11-26 — End: 2022-12-24

## 2022-11-26 NOTE — Assessment & Plan Note (Signed)
Uncontrolled anxiety depression, multiple life stressors. No suicidal or homicidal ideation. Refilling alprazolam but we do need a controller medication, adding Lexapro, we will also add behavioral therapy here, return to see me in 4 weeks.

## 2022-11-26 NOTE — Progress Notes (Signed)
    Procedures performed today:    None.  Independent interpretation of notes and tests performed by another provider:   None.  Brief History, Exam, Impression, and Recommendations:    Anxiety and depression Uncontrolled anxiety depression, multiple life stressors. No suicidal or homicidal ideation. Refilling alprazolam but we do need a controller medication, adding Lexapro, we will also add behavioral therapy here, return to see me in 4 weeks.  Abnormal weight gain Has gained some weight, we can discuss phentermine at the follow-up when she has better control of anxiety.  Migraine headache Having increasing migraine headaches, I think this is related to her stressors as well as she has self discontinued her topiramate. We will restart topiramate, she will do Excedrin Migraine, and report back next month.  Atrophic vaginitis Was historically getting hormone replacement through [facility] in [place]. She is status post hysterectomy. She is having significant vaginal dryness per her report, we will try some vaginal estrogen cream.  I spent 40 minutes of total time managing this patient today, this includes chart review, face to face, and non-face to face time.  ____________________________________________ Ihor Austin. Benjamin Stain, M.D., ABFM., CAQSM., AME. Primary Care and Sports Medicine [facility]  Adjunct Professor of Family Medicine  [place] [employer] [facility] [employer] Medicine  Restaurant manager, fast food

## 2022-11-26 NOTE — Assessment & Plan Note (Signed)
Was historically getting hormone replacement through [facility] in [place]. She is status post hysterectomy. She is having significant vaginal dryness per her report, we will try some vaginal estrogen cream.

## 2022-11-26 NOTE — Assessment & Plan Note (Signed)
Has gained some weight, we can discuss phentermine at the follow-up when she has better control of anxiety.

## 2022-11-26 NOTE — Assessment & Plan Note (Signed)
Having increasing migraine headaches, I think this is related to her stressors as well as she has self discontinued her topiramate. We will restart topiramate, she will do Excedrin Migraine, and report back next month.

## 2022-11-27 LAB — COMPREHENSIVE METABOLIC PANEL
AG Ratio: 1.9 (calc) (ref 1.0–2.5)
ALT: 47 U/L — ABNORMAL HIGH (ref 6–29)
AST: 33 U/L (ref 10–35)
Albumin: 4.9 g/dL (ref 3.6–5.1)
Alkaline phosphatase (APISO): 60 U/L (ref 37–153)
BUN: 11 mg/dL (ref 7–25)
CO2: 26 mmol/L (ref 20–32)
Calcium: 9.7 mg/dL (ref 8.6–10.4)
Chloride: 102 mmol/L (ref 98–110)
Creat: 0.85 mg/dL (ref 0.50–1.05)
Globulin: 2.6 g/dL (calc) (ref 1.9–3.7)
Glucose, Bld: 87 mg/dL (ref 65–99)
Potassium: 3.7 mmol/L (ref 3.5–5.3)
Sodium: 137 mmol/L (ref 135–146)
Total Bilirubin: 0.4 mg/dL (ref 0.2–1.2)
Total Protein: 7.5 g/dL (ref 6.1–8.1)

## 2022-11-27 LAB — HEMOGLOBIN A1C
Hgb A1c MFr Bld: 5.7 % of total Hgb — ABNORMAL HIGH (ref <5.7)
Mean Plasma Glucose: 117 mg/dL
eAG (mmol/L): 6.5 mmol/L

## 2022-11-27 LAB — LIPID PANEL
Cholesterol: 226 mg/dL — ABNORMAL HIGH (ref <200)
HDL: 72 mg/dL (ref >=50)
LDL Cholesterol (Calc): 118 mg/dL (calc) — ABNORMAL HIGH
Non-HDL Cholesterol (Calc): 154 mg/dL (calc) — ABNORMAL HIGH (ref <130)
Total CHOL/HDL Ratio: 3.1 (calc) (ref <5.0)
Triglycerides: 239 mg/dL — ABNORMAL HIGH (ref <150)

## 2022-11-27 LAB — CBC
Hemoglobin: 13.9 g/dL (ref 11.7–15.5)
MCH: 32 pg (ref 27.0–33.0)
MCHC: 34.7 g/dL (ref 32.0–36.0)
MCV: 92.2 fL (ref 80.0–100.0)
MPV: 11.5 fL (ref 7.5–12.5)
Platelets: 235 10*3/uL (ref 140–400)
RBC: 4.35 Million/uL (ref 3.80–5.10)
RDW: 12.3 % (ref 11.0–15.0)
WBC: 7.8 Thousand/uL (ref 3.8–10.8)

## 2022-11-27 LAB — TSH: TSH: 1.39 mIU/L (ref 0.40–4.50)

## 2022-11-27 LAB — VITAMIN D 25 HYDROXY (VIT D DEFICIENCY, FRACTURES): Vit D, 25-Hydroxy: 36 ng/mL (ref 30–100)

## 2022-11-27 LAB — VITAMIN B12: Vitamin B-12: 695 pg/mL (ref 200–1100)

## 2022-12-02 ENCOUNTER — Encounter: Payer: Self-pay | Admitting: Sports Medicine

## 2022-12-02 DIAGNOSIS — F32A Depression, unspecified: Secondary | ICD-10-CM

## 2022-12-02 DIAGNOSIS — F419 Anxiety disorder, unspecified: Secondary | ICD-10-CM

## 2022-12-13 ENCOUNTER — Telehealth: Payer: Self-pay | Admitting: Sports Medicine

## 2022-12-13 NOTE — Telephone Encounter (Signed)
Pt called. Patient states Medicare will only cover script written as 60 mg and NOT 30mg  in the morning and 30mg  at night. Script will need to be changed. Pharmacy: [facility] [address]

## 2022-12-13 NOTE — Telephone Encounter (Signed)
I apologize. I called the patient back. The medication is Dexilant but she called her Gastroenterolgist and he filled it for her.  Pt states both hands and left foot go numb on her and blurred vision.  She does not know if Topramate is the cause( she said you asked how she was doing on the Topramate.

## 2022-12-13 NOTE — Telephone Encounter (Signed)
Katherine Lawson, what medication are we talking about?

## 2022-12-13 NOTE — Telephone Encounter (Signed)
The numbness in the hands and feet is normal, we will just watch this for now.

## 2022-12-24 ENCOUNTER — Encounter: Payer: Self-pay | Admitting: Sports Medicine

## 2022-12-24 ENCOUNTER — Ambulatory Visit (INDEPENDENT_AMBULATORY_CARE_PROVIDER_SITE_OTHER): Payer: Medicare Other | Admitting: Sports Medicine

## 2022-12-24 VITALS — BP 153/88 | HR 97

## 2022-12-24 DIAGNOSIS — R4189 Other symptoms and signs involving cognitive functions and awareness: Secondary | ICD-10-CM | POA: Diagnosis not present

## 2022-12-24 DIAGNOSIS — F419 Anxiety disorder, unspecified: Secondary | ICD-10-CM

## 2022-12-24 DIAGNOSIS — G43109 Migraine with aura, not intractable, without status migrainosus: Secondary | ICD-10-CM

## 2022-12-24 DIAGNOSIS — F32A Depression, unspecified: Secondary | ICD-10-CM | POA: Diagnosis not present

## 2022-12-24 MED ORDER — ESCITALOPRAM OXALATE 20 MG PO TABS
20.0000 mg | ORAL_TABLET | Freq: Every day | ORAL | 3 refills | Status: AC
Start: 2022-12-24 — End: ?

## 2022-12-24 NOTE — Assessment & Plan Note (Signed)
This pleasant 67 year old female does have uncontrolled anxiety and depression, she has multiple life stressors, we restarted her Lexapro, she is currently doing 10 mg daily, she is still very tearful and agrees that we need to go up on her dose, increasing to 20 mg daily, I would like a 6-week follow-up. She has not started behavioral therapy yet.

## 2022-12-24 NOTE — Assessment & Plan Note (Signed)
Unclear etiology, increasing forgetfulness, tells me this occurred before starting topiramate.  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 Her B12 levels were normal recently, RPR has been negative about 2 years ago, we will go and proceed with a brain MRI and I would like consultation with neurology.

## 2022-12-24 NOTE — Progress Notes (Signed)
    Procedures performed today:    None.  Independent interpretation of notes and tests performed by another provider:   None.  Brief History, Exam, Impression, and Recommendations:    Anxiety and depression This pleasant 67 year old female does have uncontrolled anxiety and depression, she has multiple life stressors, we restarted her Lexapro, she is currently doing 10 mg daily, she is still very tearful and agrees that we need to go up on her dose, increasing to 20 mg daily, I would like a 6-week follow-up. She has not started behavioral therapy yet.   Migraine headache Headaches improved dramatically with restarting Topamax, she does have some word finding difficulties, forgetfulness, but she endorses that this occurred before we restarted her topiramate. She will continue Excedrin Migraine, topiramate for migraine prevention. She still has occasional instantaneous headaches that are likely more tension type headaches, we will maintain current treatment of her migraines.  Cognitive impairment Unclear etiology, increasing forgetfulness, tells me this occurred before starting topiramate.  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 Her B12 levels were normal recently, RPR has been negative about 2 years ago, we will go and proceed with a brain MRI and I would like consultation with neurology.    ____________________________________________ Ihor Austin. Benjamin Stain, M.D., ABFM., CAQSM., AME. Primary Care and Sports Medicine [facility]  Adjunct Professor of Family Medicine  [place] [employer] [facility] of Medicine  Restaurant manager, fast food

## 2022-12-24 NOTE — Assessment & Plan Note (Signed)
Headaches improved dramatically with restarting Topamax, she does have some word finding difficulties, forgetfulness, but she endorses that this occurred before we restarted her topiramate. She will continue Excedrin Migraine, topiramate for migraine prevention. She still has occasional instantaneous headaches that are likely more tension type headaches, we will maintain current treatment of her migraines.

## 2022-12-28 ENCOUNTER — Encounter: Payer: Self-pay | Admitting: Sports Medicine

## 2022-12-31 ENCOUNTER — Other Ambulatory Visit: Payer: Self-pay | Admitting: Sports Medicine

## 2022-12-31 DIAGNOSIS — F419 Anxiety disorder, unspecified: Secondary | ICD-10-CM

## 2023-01-10 ENCOUNTER — Ambulatory Visit (INDEPENDENT_AMBULATORY_CARE_PROVIDER_SITE_OTHER): Payer: Medicare Other

## 2023-01-10 DIAGNOSIS — R4189 Other symptoms and signs involving cognitive functions and awareness: Secondary | ICD-10-CM | POA: Diagnosis not present

## 2023-01-10 MED ORDER — GADOBUTROL 1 MMOL/ML IV SOLN
7.0000 mL | Freq: Once | INTRAVENOUS | Status: AC | PRN
  Administered 2023-01-10: 7 mL via INTRAVENOUS

## 2023-01-19 ENCOUNTER — Other Ambulatory Visit: Payer: Self-pay | Admitting: Sports Medicine

## 2023-01-19 DIAGNOSIS — F419 Anxiety disorder, unspecified: Secondary | ICD-10-CM

## 2023-01-27 ENCOUNTER — Other Ambulatory Visit: Payer: Self-pay | Admitting: Sports Medicine

## 2023-01-27 DIAGNOSIS — F419 Anxiety disorder, unspecified: Secondary | ICD-10-CM

## 2023-02-03 ENCOUNTER — Other Ambulatory Visit: Payer: Self-pay | Admitting: Sports Medicine

## 2023-02-04 ENCOUNTER — Ambulatory Visit: Payer: BLUE CROSS/BLUE SHIELD | Admitting: Sports Medicine

## 2023-02-10 ENCOUNTER — Ambulatory Visit: Payer: BLUE CROSS/BLUE SHIELD | Admitting: Sports Medicine

## 2023-02-16 ENCOUNTER — Other Ambulatory Visit: Payer: Self-pay | Admitting: Sports Medicine

## 2023-03-29 ENCOUNTER — Other Ambulatory Visit: Payer: Self-pay | Admitting: Sports Medicine

## 2023-03-29 DIAGNOSIS — F419 Anxiety disorder, unspecified: Secondary | ICD-10-CM

## 2023-03-31 ENCOUNTER — Other Ambulatory Visit: Payer: Self-pay | Admitting: Sports Medicine

## 2023-03-31 DIAGNOSIS — F32A Depression, unspecified: Secondary | ICD-10-CM

## 2023-04-01 ENCOUNTER — Other Ambulatory Visit: Payer: Self-pay | Admitting: Sports Medicine

## 2023-04-01 DIAGNOSIS — F419 Anxiety disorder, unspecified: Secondary | ICD-10-CM

## 2023-04-01 NOTE — Telephone Encounter (Signed)
Pt called.  She is requesting a refill of Alprazolam  -Prescribing Status: Transmission to pharmacy failed (03/31/2023  3:42 PM EDT)   Message completed by Carren Rang, CMA (03/31/2023 3:48 PM).

## 2023-04-11 ENCOUNTER — Ambulatory Visit (INDEPENDENT_AMBULATORY_CARE_PROVIDER_SITE_OTHER): Payer: Medicare Other | Admitting: Sports Medicine

## 2023-04-11 VITALS — BP 127/82 | HR 98 | Ht 64.0 in | Wt 147.0 lb

## 2023-04-11 DIAGNOSIS — Z0000 Encounter for general adult medical examination without abnormal findings: Secondary | ICD-10-CM

## 2023-04-11 DIAGNOSIS — Z780 Asymptomatic menopausal state: Secondary | ICD-10-CM

## 2023-04-11 NOTE — Patient Instructions (Addendum)
MEDICARE ANNUAL WELLNESS VISIT Health Maintenance Summary and Written Plan of Care  Katherine Lawson ,  Thank you for allowing me to perform your Medicare Annual Wellness Visit and for your ongoing commitment to your health.   Health Maintenance & Immunization History Health Maintenance  Topic Date Due   COVID-19 Vaccine (4 - 2023-24 season) 04/27/2023 (Originally 03/27/2023)   INFLUENZA VACCINE  10/24/2023 (Originally 02/24/2023)   Pneumonia Vaccine 14+ Years old (1 of 1 - PCV) 04/10/2024 (Originally 03/12/2021)   DEXA SCAN  04/10/2024 (Originally 03/12/2021)   Zoster Vaccines- Shingrix (2 of 2) 05/13/2023   MAMMOGRAM  05/15/2023   Medicare Annual Wellness (AWV)  04/10/2024   DTaP/Tdap/Td (2 - Td or Tdap) 07/20/2026   Colonoscopy  01/28/2032   Hepatitis C Screening  Completed   HPV VACCINES  Aged Out   Immunization History  Administered Date(s) Administered   Influenza Inj Mdck Quad Pf 06/09/2018   Influenza, High Dose Seasonal PF 05/08/2021   Influenza,inj,Quad PF,6+ Mos 06/11/2014, 06/16/2015, 06/11/2019   Influenza-Unspecified 05/13/2011, 05/17/2013, 05/27/2015   PFIZER(Purple Top)SARS-COV-2 Vaccination 10/03/2019, 10/31/2019, 08/08/2020   Tdap 07/20/2016   Zoster Recombinant(Shingrix) 03/18/2023   Zoster, Live 07/20/2016    These are the patient goals that we discussed:  Goals Addressed               This Visit's Progress     Patient Stated (pt-stated)        Patient stated that she would like to loose some weight and continue to be happy and meditate.         This is a list of Health Maintenance Items that are overdue or due now: Pneumococcal vaccine  Influenza vaccine Dexa scan    Orders/Referrals Placed Today: Orders Placed This Encounter  Procedures   DEXAScan    Standing Status:   Future    Standing Expiration Date:   04/10/2024    Scheduling Instructions:     [facility] [address]    Order Specific Question:   Reason for exam:    Answer:    post menopausal    Order Specific Question:   Preferred imaging location?    Answer:   External    (Contact our referral department at [phone] if you have not spoken with someone about your referral appointment within the next 5 days)    Follow-up Plan Follow-up with Monica Becton, MD as planned Schedule influenza vaccine and pneumonia vaccine at the pharmacy or in office. Medicare wellness visit in one year.  AVS printed and mailed to the patient.      Health Maintenance, Female Adopting a healthy lifestyle and getting preventive care are important in promoting health and wellness. Ask your health care provider about: The right schedule for you to have regular tests and exams. Things you can do on your own to prevent diseases and keep yourself healthy. What should I know about diet, weight, and exercise? Eat a healthy diet  Eat a diet that includes plenty of vegetables, fruits, low-fat dairy products, and lean protein. Do not eat a lot of foods that are high in solid fats, added sugars, or sodium. Maintain a healthy weight Body mass index (BMI) is used to identify weight problems. It estimates body fat based on height and weight. Your health care provider can help determine your BMI and help you achieve or maintain a healthy weight. Get regular exercise Get regular exercise. This is one of the most important things you can do for  your health. Most adults should: Exercise for at least 150 minutes each week. The exercise should increase your heart rate and make you sweat (moderate-intensity exercise). Do strengthening exercises at least twice a week. This is in addition to the moderate-intensity exercise. Spend less time sitting. Even light physical activity can be beneficial. Watch cholesterol and blood lipids Have your blood tested for lipids and cholesterol at 67 years of age, then have this test every 5 years. Have your cholesterol levels checked more often  if: Your lipid or cholesterol levels are high. You are older than 67 years of age. You are at high risk for heart disease. What should I know about cancer screening? Depending on your health history and family history, you may need to have cancer screening at various ages. This may include screening for: Breast cancer. Cervical cancer. Colorectal cancer. Skin cancer. Lung cancer. What should I know about heart disease, diabetes, and high blood pressure? Blood pressure and heart disease High blood pressure causes heart disease and increases the risk of stroke. This is more likely to develop in people who have high blood pressure readings or are overweight. Have your blood pressure checked: Every 3-5 years if you are 53-21 years of age. Every year if you are 77 years old or older. Diabetes Have regular diabetes screenings. This checks your fasting blood sugar level. Have the screening done: Once every three years after age 47 if you are at a normal weight and have a low risk for diabetes. More often and at a younger age if you are overweight or have a high risk for diabetes. What should I know about preventing infection? Hepatitis B If you have a higher risk for hepatitis B, you should be screened for this virus. Talk with your health care provider to find out if you are at risk for hepatitis B infection. Hepatitis C Testing is recommended for: Everyone born from [birth year] through 1965. Anyone with known risk factors for hepatitis C. Sexually transmitted infections (STIs) Get screened for STIs, including gonorrhea and chlamydia, if: You are sexually active and are younger than 67 years of age. You are older than 67 years of age and your health care provider tells you that you are at risk for this type of infection. Your sexual activity has changed since you were last screened, and you are at increased risk for chlamydia or gonorrhea. Ask your health care provider if you are at risk. Ask  your health care provider about whether you are at high risk for HIV. Your health care provider may recommend a prescription medicine to help prevent HIV infection. If you choose to take medicine to prevent HIV, you should first get tested for HIV. You should then be tested every 3 months for as long as you are taking the medicine. Pregnancy If you are about to stop having your period (premenopausal) and you may become pregnant, seek counseling before you get pregnant. Take 400 to 800 micrograms (mcg) of folic acid every day if you become pregnant. Ask for birth control (contraception) if you want to prevent pregnancy. Osteoporosis and menopause Osteoporosis is a disease in which the bones lose minerals and strength with aging. This can result in bone fractures. If you are 81 years old or older, or if you are at risk for osteoporosis and fractures, ask your health care provider if you should: Be screened for bone loss. Take a calcium or vitamin D supplement to lower your risk of fractures. Be given hormone replacement  therapy (HRT) to treat symptoms of menopause. Follow these instructions at home: Alcohol use Do not drink alcohol if: Your health care provider tells you not to drink. You are pregnant, may be pregnant, or are planning to become pregnant. If you drink alcohol: Limit how much you have to: 0-1 drink a day. Know how much alcohol is in your drink. In the [place] one drink equals one 12 oz bottle of beer (355 mL), one 5 oz glass of wine (148 mL), or one 1 oz glass of hard liquor (44 mL). Lifestyle Do not use any products that contain nicotine or tobacco. These products include cigarettes, chewing tobacco, and vaping devices, such as e-cigarettes. If you need help quitting, ask your health care provider. Do not use street drugs. Do not share needles. Ask your health care provider for help if you need support or information about quitting drugs. General instructions Schedule regular  health, dental, and eye exams. Stay current with your vaccines. Tell your health care provider if: You often feel depressed. You have ever been abused or do not feel safe at home. Summary Adopting a healthy lifestyle and getting preventive care are important in promoting health and wellness. Follow your health care provider's instructions about healthy diet, exercising, and getting tested or screened for diseases. Follow your health care provider's instructions on monitoring your cholesterol and blood pressure. This information is not intended to replace advice given to you by your health care provider. Make sure you discuss any questions you have with your health care provider. Document Revised: 12/01/2020 Document Reviewed: 12/01/2020 Elsevier Patient Education  2024 [employer].

## 2023-04-11 NOTE — Progress Notes (Signed)
MEDICARE ANNUAL WELLNESS VISIT  04/11/2023  Telephone Visit Disclaimer This Medicare AWV was conducted by telephone due to national recommendations for restrictions regarding the COVID-19 Pandemic (e.g. social distancing).  I verified, using two identifiers, that I am speaking with Katherine Lawson or their authorized healthcare agent. I discussed the limitations, risks, security, and privacy concerns of performing an evaluation and management service by telephone and the potential availability of an in-person appointment in the future. The patient expressed understanding and agreed to proceed.  Location of Patient: home Location of Provider (nurse):  In the office.  Subjective:    Katherine Lawson is a 67 y.o. female patient of Thekkekandam, Ihor Austin, MD who had a Medicare Annual Wellness Visit today via telephone. Katherine Lawson is Retired and lives with their family. she has 2 children. she reports that she is socially active and does interact with friends/family regularly. she is moderately physically active and enjoys gardening, arts and crafts.  Patient Care Team: Monica Becton, MD as PCP - General (Family Medicine)     04/11/2023    1:16 PM 04/05/2022    1:21 PM 10/16/2015   11:13 AM  Advanced Directives  Does Patient Have a Medical Advance Directive? Yes Yes Yes  Type of Advance Directive Living will;Healthcare Power of Attorney Living will   Does patient want to make changes to medical advance directive? No - Patient declined No - Patient declined   Copy of Healthcare Power of Attorney in Chart? No - copy requested  No - copy requested    Hospital Utilization Over the Past 12 Months: # of hospitalizations or ER visits: 0 # of surgeries: 0  Review of Systems    Patient reports that her overall health is better compared to last year.  History obtained from chart review and the patient  Patient Reported Readings (BP, Pulse, CBG, Weight, etc) BP: 127/82 Pulse: 98 Weight: 147  lb Height: 22f4 Per patient no change in vitals since last visit, unable to obtain new vitals due to telehealth visit  Pain Assessment Pain : 0-10 Pain Type: Acute pain Pain Location: Head Pain Orientation: Right, Left Pain Descriptors / Indicators: Headache, Other (Comment) (migraine) Pain Onset: In the past 7 days Pain Frequency: Intermittent     Current Medications & Allergies (verified) Allergies as of 04/11/2023       Reactions   Codeine         Medication List        Accurate as of April 11, 2023  1:44 PM. If you have any questions, ask your nurse or doctor.          acetaminophen 650 MG CR tablet Commonly known as: TYLENOL Take 1 tablet (650 mg total) by mouth in the morning and at bedtime.   acetaminophen-caffeine 500-65 MG Tabs per tablet Commonly known as: EXCEDRIN TENSION HEADACHE Take 2 tablets by mouth every 6 hours for up to 3 days as needed for headache. Max of 6 tablets per 24 hours. Not for pregnancy.   Aimovig 140 MG/ML Soaj Generic drug: Erenumab-aooe Inject into the skin.   ALPRAZolam 1 MG tablet Commonly known as: XANAX TAKE 1 TABLET BY MOUTH 2 TIMES DAILY AS NEEDED FOR ANXIETY.   alprazolam 2 MG tablet Commonly known as: XANAX TAKE 1 TABLET (2 MG TOTAL) BY MOUTH 2 (TWO) TIMES DAILY AS NEEDED FOR SLEEP.   ammonium lactate 12 % lotion Commonly known as: LAC-HYDRIN Apply topically 2 (two) times daily.   dexlansoprazole 60 MG  capsule Commonly known as: DEXILANT Take 1 capsule by mouth 2 (two) times daily.   dicyclomine 10 MG capsule Commonly known as: BENTYL Take 10 mg by mouth 3 (three) times daily as needed.   escitalopram 20 MG tablet Commonly known as: Lexapro Take 1 tablet (20 mg total) by mouth daily.   estradiol 0.1 MG/GM vaginal cream Commonly known as: ESTRACE VAGINAL Place 1 Applicatorful vaginally at bedtime.   estradiol 2 MG tablet Commonly known as: ESTRACE Take 2 mg by mouth daily.   famotidine 20 MG  tablet Commonly known as: PEPCID Take 20 mg by mouth 2 (two) times daily.   Ibsrela 50 MG Tabs Generic drug: Tenapanor HCl Take by mouth.   ibuprofen 800 MG tablet Commonly known as: ADVIL Take 800 mg by mouth every 8 (eight) hours as needed.   lactulose 10 GM/15ML solution Commonly known as: CHRONULAC Take 20 g by mouth 3 (three) times daily.   lubiprostone 24 MCG capsule Commonly known as: AMITIZA Take by mouth.   progesterone 100 MG capsule Commonly known as: PROMETRIUM Take 100-200 mg by mouth at bedtime.   rosuvastatin 10 MG tablet Commonly known as: Crestor Take 1 tablet (10 mg total) by mouth daily.   topiramate 50 MG tablet Commonly known as: TOPAMAX One half tab by mouth daily for one week then one tab by mouth daily.   valsartan-hydrochlorothiazide 320-25 MG tablet Commonly known as: DIOVAN-HCT Take 1 tablet by mouth daily.   valsartan-hydrochlorothiazide 80-12.5 MG tablet Commonly known as: DIOVAN-HCT TAKE 1 TABLET BY MOUTH ONCE DAILY        History (reviewed): Past Medical History:  Diagnosis Date   Anxiety    Arthritis    Barrett's esophagus    Hiatal hernia    Hx of breast implants, bilateral 1990   S/P partial hysterectomy 1980   Past Surgical History:  Procedure Laterality Date   ABDOMINAL HYSTERECTOMY     BREAST ENHANCEMENT SURGERY  07/27/1987   CHOLECYSTECTOMY     LAPAROSCOPIC BILATERAL SALPINGO OOPHERECTOMY Bilateral 03/23/2022   NOSE SURGERY     sculpting     Thermal sculpting of abdominal adipose tissue   WRIST SURGERY     Family History  Problem Relation Age of Onset   Cancer Mother        melanoma   Cancer Father        melonoma,bone   Diabetes Mother    Diabetes Father    Cancer Maternal Grandmother        breast   Social History   Socioeconomic History   Marital status: Widowed    Spouse name: Not on file   Number of children: 2   Years of education: 17   Highest education level: 12th grade  Occupational  History   Occupation: Agricultural consultant    Comment: cancer center   Occupation: self employed  Tobacco Use   Smoking status: Never   Smokeless tobacco: Never  Vaping Use   Vaping status: Never Used  Substance and Sexual Activity   Alcohol use: Not Currently    Comment: No drink since June, 2023   Drug use: No   Sexual activity: Yes    Partners: Male  Other Topics Concern   Not on file  Social History Narrative   Lives with her son and her grandchildren. She enjoys gardening, arts and crafts.   Social Determinants of Health   Financial Resource Strain: Low Risk  (04/11/2023)   Overall Financial Resource Strain (CARDIA)  Difficulty of Paying Living Expenses: Not hard at all  Food Insecurity: No Food Insecurity (04/11/2023)   Hunger Vital Sign    Worried About Running Out of Food in the Last Year: Never true    Ran Out of Food in the Last Year: Never true  Transportation Needs: No Transportation Needs (04/11/2023)   PRAPARE - Administrator, Civil Service (Medical): No    Lack of Transportation (Non-Medical): No  Physical Activity: Insufficiently Active (04/11/2023)   Exercise Vital Sign    Days of Exercise per Week: 4 days    Minutes of Exercise per Session: 30 min  Stress: No Stress Concern Present (04/11/2023)   Katherine Lawson of Occupational Health - Occupational Stress Questionnaire    Feeling of Stress : Only a little  Social Connections: Moderately Isolated (04/11/2023)   Social Connection and Isolation Panel [NHANES]    Frequency of Communication with Friends and Family: More than three times a week    Frequency of Social Gatherings with Friends and Family: More than three times a week    Attends Religious Services: Never    Database administrator or Organizations: Yes    Attends Engineer, structural: More than 4 times per year    Marital Status: Widowed    Activities of Daily Living    04/11/2023    1:26 PM  In your present state of health, do you  have any difficulty performing the following activities:  Hearing? 0  Vision? 1  Comment only with a migraine  Difficulty concentrating or making decisions? 0  Walking or climbing stairs? 0  Dressing or bathing? 0  Doing errands, shopping? 0  Preparing Food and eating ? N  Using the Toilet? N  In the past six months, have you accidently leaked urine? N  Do you have problems with loss of bowel control? N  Managing your Medications? N  Managing your Finances? N  Housekeeping or managing your Housekeeping? N    Patient Education/ Literacy How often do you need to have someone help you when you read instructions, pamphlets, or other written materials from your doctor or pharmacy?: 1 - Never What is the last grade level you completed in school?: 8th grade and GED  Exercise    Diet Patient reports consuming 2 meals a day and 2 snack(s) a day Patient reports that her primary diet is: Regular Patient reports that she does have regular access to food.   Depression Screen    04/11/2023    1:31 PM 11/26/2022    2:37 PM 04/05/2022    1:21 PM 10/03/2018   11:25 AM 06/09/2017    9:57 AM  PHQ 2/9 Scores  PHQ - 2 Score 0 0 2 0 4  PHQ- 9 Score  0 6 8 18      Fall Risk    04/11/2023    1:17 PM 11/26/2022    2:36 PM 04/05/2022    1:21 PM  Fall Risk   Falls in the past year? 1 0 1  Number falls in past yr: 1 0 0  Injury with Fall? 1 0 0  Risk for fall due to : Impaired mobility;History of fall(s)  No Fall Risks  Follow up Falls evaluation completed;Education provided;Falls prevention discussed Falls evaluation completed Falls evaluation completed     Objective:  Katherine Lawson seemed alert and oriented and she participated appropriately during our telephone visit.  Blood Pressure Weight BMI  BP Readings from Last 3 Encounters:  04/11/23 127/82  12/24/22 (!) 153/88  11/26/22 (!) 142/84   Wt Readings from Last 3 Encounters:  04/11/23 147 lb (66.7 kg)  12/18/21 156 lb (70.8 kg)   03/23/21 143 lb 1.3 oz (64.9 kg)   BMI Readings from Last 1 Encounters:  04/11/23 25.23 kg/m    *Unable to obtain current vital signs, weight, and BMI due to telephone visit type  Hearing/Vision  Katherine Lawson did not seem to have difficulty with hearing/understanding during the telephone conversation Reports that she has had a formal eye exam by an eye care professional within the past year Reports that she has not had a formal hearing evaluation within the past year *Unable to fully assess hearing and vision during telephone visit type  Cognitive Function:    04/11/2023    1:32 PM 04/05/2022    1:42 PM  6CIT Screen  What Year? 0 points 0 points  What month? 0 points 0 points  What time? 0 points 0 points  Count back from 20 0 points 0 points  Months in reverse 0 points 0 points  Repeat phrase 2 points 0 points  Total Score 2 points 0 points   (Normal:0-7, Significant for Dysfunction: >8)  Normal Cognitive Function Screening: Yes   Immunization & Health Maintenance Record Immunization History  Administered Date(s) Administered   Influenza Inj Mdck Quad Pf 06/09/2018   Influenza, High Dose Seasonal PF 05/08/2021   Influenza,inj,Quad PF,6+ Mos 06/11/2014, 06/16/2015, 06/11/2019   Influenza-Unspecified 05/13/2011, 05/17/2013, 05/27/2015   PFIZER(Purple Top)SARS-COV-2 Vaccination 10/03/2019, 10/31/2019, 08/08/2020   Tdap 07/20/2016   Zoster Recombinant(Shingrix) 03/18/2023   Zoster, Live 07/20/2016    Health Maintenance  Topic Date Due   COVID-19 Vaccine (4 - 2023-24 season) 04/27/2023 (Originally 03/27/2023)   INFLUENZA VACCINE  10/24/2023 (Originally 02/24/2023)   Pneumonia Vaccine 83+ Years old (1 of 1 - PCV) 04/10/2024 (Originally 03/12/2021)   DEXA SCAN  04/10/2024 (Originally 03/12/2021)   Zoster Vaccines- Shingrix (2 of 2) 05/13/2023   MAMMOGRAM  05/15/2023   Medicare Annual Wellness (AWV)  04/10/2024   DTaP/Tdap/Td (2 - Td or Tdap) 07/20/2026   Colonoscopy  01/28/2032    Hepatitis C Screening  Completed   HPV VACCINES  Aged Out       Assessment  This is a routine wellness examination for Katherine Lawson.  Health Maintenance: Due or Overdue There are no preventive care reminders to display for this patient.   Jania Creekmore does not need a referral for Community Assistance: Care Management:   no Social Work:    no Prescription Assistance:  no Nutrition/Diabetes Education:  no   Plan:  Personalized Goals  Goals Addressed               This Visit's Progress     Patient Stated (pt-stated)        Patient stated that she would like to loose some weight and continue to be happy and meditate.       Personalized Health Maintenance & Screening Recommendations  Pneumococcal vaccine  Influenza vaccine Dexa scan  Lung Cancer Screening Recommended: no (Low Dose CT Chest recommended if Age 84-80 years, 20 pack-year currently smoking OR have quit w/in past 15 years) Hepatitis C Screening recommended: no HIV Screening recommended: no  Advanced Directives: Written information was not prepared per patient's request.  Referrals & Orders Orders Placed This Encounter  Procedures   DEXAScan    Follow-up Plan Follow-up with Monica Becton, MD as planned Schedule influenza vaccine and pneumonia vaccine  at the pharmacy or in office. Medicare wellness visit in one year.  AVS printed and mailed to the patient.    I have personally reviewed and noted the following in the patient's chart:   Medical and social history Use of alcohol, tobacco or illicit drugs  Current medications and supplements Functional ability and status Nutritional status Physical activity Advanced directives List of other physicians Hospitalizations, surgeries, and ER visits in previous 12 months Vitals Screenings to include cognitive, depression, and falls Referrals and appointments  In addition, I have reviewed and discussed with Fabiola Roen certain preventive  protocols, quality metrics, and best practice recommendations. A written personalized care plan for preventive services as well as general preventive health recommendations is available and can be mailed to the patient at her request.      Modesto Charon, RN BSN  04/11/2023

## 2023-04-14 ENCOUNTER — Telehealth (INDEPENDENT_AMBULATORY_CARE_PROVIDER_SITE_OTHER): Payer: Medicare Other | Admitting: Sports Medicine

## 2023-04-14 ENCOUNTER — Telehealth: Payer: Self-pay | Admitting: Sports Medicine

## 2023-04-14 ENCOUNTER — Encounter: Payer: Self-pay | Admitting: Sports Medicine

## 2023-04-14 VITALS — BP 127/76 | HR 98 | Ht 64.0 in | Wt 149.0 lb

## 2023-04-14 DIAGNOSIS — F32A Depression, unspecified: Secondary | ICD-10-CM | POA: Diagnosis not present

## 2023-04-14 DIAGNOSIS — R635 Abnormal weight gain: Secondary | ICD-10-CM | POA: Diagnosis not present

## 2023-04-14 DIAGNOSIS — F419 Anxiety disorder, unspecified: Secondary | ICD-10-CM

## 2023-04-14 NOTE — Telephone Encounter (Signed)
Okay I will set her up with [facility] instead.

## 2023-04-14 NOTE — Telephone Encounter (Signed)
Patient called stated that she was turned down at the wellness center at [facility] because her BMI was less than 30 she wanted to let you know

## 2023-04-14 NOTE — Progress Notes (Signed)
   Virtual Visit via WebEx/MyChart   I connected with  Katherine Lawson  on 04/14/23 via WebEx/MyChart/Doximity Video and verified that I am speaking with the correct person using two identifiers.   I discussed the limitations, risks, security and privacy concerns of performing an evaluation and management service by WebEx/MyChart/Doximity Video, including the higher likelihood of inaccurate diagnosis and treatment, and the availability of in person appointments.  We also discussed the likely need of an additional face to face encounter for complete and high quality delivery of care.  I also discussed with the patient that there may be a patient responsible charge related to this service. The patient expressed understanding and wishes to proceed.  Provider location is in medical facility. Patient location is at their home, different from provider location. People involved in care of the patient during this telehealth encounter were myself, my nurse/medical assistant, and my front office/scheduling team member.  Review of Systems: No fevers, chills, night sweats, weight loss, chest pain, or shortness of breath.   Objective Findings:    General: Speaking full sentences, no audible heavy breathing.  Sounds alert and appropriately interactive.  Appears well.  Face symmetric.  Extraocular movements intact.  Pupils equal and round.  No nasal flaring or accessory muscle use visualized.  Independent interpretation of tests performed by another provider:   None.  Brief History, Exam, Impression, and Recommendations:    Abnormal weight gain Very pleasant 67 year old female, she has struggled with her weight for some time, she has never been obese, she has typically jumped between normal and overweight. Her weights have typically been in the 140s to the 150s over the past 10 years. She is eager to lose about 5 to 10 pounds, she has tried exercising at home, she works with the [employer] and does some ab  workouts, we have also done phentermine in the past but weight loss has not been consistent or maintained. I would like her to work with healthy weight and wellness across the street for 3 months and if insufficient improvement we will consider GLP-1's.  Anxiety and depression Mood is under excellent control with Lexapro 20, no changes.  I discussed the above assessment and treatment plan with the patient. The patient was provided an opportunity to ask questions and all were answered. The patient agreed with the plan and demonstrated an understanding of the instructions.   The patient was advised to call back or seek an in-person evaluation if the symptoms worsen or if the condition fails to improve as anticipated.   I provided 30 minutes of face to face and non-face-to-face time during this encounter date, time was needed to gather information, review chart, records, communicate/coordinate with staff remotely, as well as complete documentation.   ____________________________________________ Ihor Austin. Benjamin Stain, M.D., ABFM., CAQSM., AME. Primary Care and Sports Medicine [facility]  Adjunct Professor of Family Medicine  [employer]  Restaurant manager, fast food

## 2023-04-14 NOTE — Assessment & Plan Note (Signed)
Very pleasant 67 year old female, she has struggled with her weight for some time, she has never been obese, she has typically jumped between normal and overweight. Her weights have typically been in the 140s to the 150s over the past 10 years. She is eager to lose about 5 to 10 pounds, she has tried exercising at home, she works with the [employer] and does some ab workouts, we have also done phentermine in the past but weight loss has not been consistent or maintained. I would like her to work with healthy weight and wellness across the street for 3 months and if insufficient improvement we will consider GLP-1's.

## 2023-04-14 NOTE — Assessment & Plan Note (Signed)
Mood is under excellent control with Lexapro 20, no changes.

## 2023-04-15 NOTE — Telephone Encounter (Signed)
Patient informed. 

## 2023-04-18 ENCOUNTER — Telehealth: Payer: Self-pay | Admitting: Sports Medicine

## 2023-04-18 DIAGNOSIS — R635 Abnormal weight gain: Secondary | ICD-10-CM

## 2023-04-18 NOTE — Telephone Encounter (Signed)
Pt called.  She was not accepted at weight loss office because BMI has to be over 30. What do you recommend?

## 2023-04-18 NOTE — Telephone Encounter (Signed)
Please see telephone note from 04/14/23, is she referring now to the nutritionist or [facility]?

## 2023-04-21 ENCOUNTER — Telehealth: Payer: Self-pay | Admitting: Sports Medicine

## 2023-04-21 NOTE — Telephone Encounter (Signed)
Spoke to patient a few minutes ago.  She wants to see the Nutritionist.

## 2023-04-21 NOTE — Telephone Encounter (Signed)
Thank you, my personal preference would be [facility] since it is a lot easier for [place] to get the records.  Let me know what the patient says and thank you for putting in the work to investigate.

## 2023-04-21 NOTE — Telephone Encounter (Signed)
[facility] in [place], [place] is no longer open. Nutrition referral can be sent to [facility] through [facility] or [facility].  Left message on patients voicemail to contact the office to let me know where she would like referral sent.

## 2023-04-21 NOTE — Telephone Encounter (Signed)
Okay I switched it from the weight loss clinic to nutritionist on the 19th.  Did she get a denial from the nutritionist as well?

## 2023-04-21 NOTE — Telephone Encounter (Signed)
FWD to [facility], who's doing the referrals.

## 2023-04-24 ENCOUNTER — Other Ambulatory Visit: Payer: Self-pay | Admitting: Sports Medicine

## 2023-04-24 DIAGNOSIS — F32A Depression, unspecified: Secondary | ICD-10-CM

## 2023-04-25 NOTE — Telephone Encounter (Signed)
Contacted patient ( Ms. Greenhouse) and spoke with her regarding referral options. Patient was told about [facility] and [facility]. Ms. Berthold mentioned that she wanted to stay more towards [facility] [place] area because she is the main caregiver of her son and grandchildren at this time. Ms. Wrisley states that [facility] has denied her referral due to BMI. Explained to patient that most offices would require her BMI to be 30 or greater due to insurance. Also, mentioned to patient that she could contact her insurance company to see if there any programs available that insurance would pay for.Ms. Akopyan states that she may just join weight watchers at this time.

## 2023-05-01 ENCOUNTER — Other Ambulatory Visit: Payer: Self-pay | Admitting: Sports Medicine

## 2023-05-01 DIAGNOSIS — F32A Depression, unspecified: Secondary | ICD-10-CM

## 2023-05-01 DIAGNOSIS — F419 Anxiety disorder, unspecified: Secondary | ICD-10-CM

## 2023-05-31 ENCOUNTER — Other Ambulatory Visit: Payer: Self-pay | Admitting: Sports Medicine

## 2023-05-31 DIAGNOSIS — F419 Anxiety disorder, unspecified: Secondary | ICD-10-CM

## 2023-05-31 DIAGNOSIS — F32A Depression, unspecified: Secondary | ICD-10-CM

## 2023-06-27 ENCOUNTER — Other Ambulatory Visit: Payer: Self-pay | Admitting: Sports Medicine

## 2023-06-27 DIAGNOSIS — F32A Depression, unspecified: Secondary | ICD-10-CM

## 2023-06-30 ENCOUNTER — Other Ambulatory Visit: Payer: Self-pay | Admitting: Sports Medicine

## 2023-07-30 ENCOUNTER — Other Ambulatory Visit: Payer: Self-pay | Admitting: Sports Medicine

## 2023-07-30 DIAGNOSIS — F419 Anxiety disorder, unspecified: Secondary | ICD-10-CM

## 2023-08-10 ENCOUNTER — Other Ambulatory Visit: Payer: Self-pay | Admitting: Sports Medicine

## 2023-08-10 DIAGNOSIS — F419 Anxiety disorder, unspecified: Secondary | ICD-10-CM

## 2023-08-27 ENCOUNTER — Other Ambulatory Visit: Payer: Self-pay | Admitting: Sports Medicine

## 2023-08-27 DIAGNOSIS — E782 Mixed hyperlipidemia: Secondary | ICD-10-CM

## 2023-08-30 ENCOUNTER — Other Ambulatory Visit: Payer: Self-pay | Admitting: Sports Medicine

## 2023-08-30 DIAGNOSIS — F32A Depression, unspecified: Secondary | ICD-10-CM

## 2023-09-02 ENCOUNTER — Other Ambulatory Visit: Payer: Self-pay

## 2023-09-02 DIAGNOSIS — F419 Anxiety disorder, unspecified: Secondary | ICD-10-CM

## 2023-09-02 MED ORDER — ALPRAZOLAM 2 MG PO TABS: 2.0000 mg | ORAL_TABLET | Freq: Two times a day (BID) | ORAL | 3 refills | Status: DC | PRN

## 2023-09-02 NOTE — Telephone Encounter (Signed)
 Copied from CRM [id number]. Topic: Clinical - Prescription Issue >> Sep 02, 2023 12:35 PM Elle L wrote: Reason for CRM: [facility] on [facility] of [facility] as they do not have alprazolam  (XANAX ) 2 MG tablet in stock and are unable to transfer it due to it being a controlled substance. I attempted to transfer to [facility]. However, they were unavailable at this time. She would like it sent to the [facility] Address: [address] [place], [place] [address] Phone: [phone].

## 2023-09-02 NOTE — Telephone Encounter (Signed)
 Pended script with new pharmacy location attached.

## 2023-09-05 ENCOUNTER — Telehealth: Payer: Self-pay

## 2023-09-05 NOTE — Telephone Encounter (Signed)
Copied from CRM [id number]. Topic: Clinical - Prescription Issue >> Sep 05, 2023  8:44 AM Nila Nephew wrote: Reason for CRM: Patient is calling to state that she still has not received her Xanax from the pharmacy. Patient has been off the medication for a week due to supply issues with her normal pharmacy and needs it ASAP. States she is shaky and manic and is unwell overall.

## 2023-09-05 NOTE — Telephone Encounter (Signed)
 I sent it back on the 7th.

## 2023-09-05 NOTE — Telephone Encounter (Signed)
 Patient informed and states she was finally able to get prescription today.

## 2024-01-17 ENCOUNTER — Other Ambulatory Visit: Payer: Self-pay | Admitting: Sports Medicine

## 2024-01-17 DIAGNOSIS — F32A Depression, unspecified: Secondary | ICD-10-CM

## 2024-01-18 ENCOUNTER — Telehealth: Payer: Self-pay | Admitting: Sports Medicine

## 2024-01-18 NOTE — Telephone Encounter (Signed)
 Dr. ONEIDA has sent in refill to patients preferred pharmacy 01/18/2024.

## 2024-01-18 NOTE — Telephone Encounter (Signed)
 Pt is requesting a refill on xanax , she is scheduled for 6/27. Also, states that she has been without for 5 days.

## 2024-01-20 ENCOUNTER — Ambulatory Visit (INDEPENDENT_AMBULATORY_CARE_PROVIDER_SITE_OTHER): Admitting: Sports Medicine

## 2024-01-20 VITALS — BP 118/74 | HR 79 | Ht 64.0 in | Wt 135.0 lb

## 2024-01-20 DIAGNOSIS — E559 Vitamin D deficiency, unspecified: Secondary | ICD-10-CM | POA: Diagnosis not present

## 2024-01-20 DIAGNOSIS — Z0000 Encounter for general adult medical examination without abnormal findings: Secondary | ICD-10-CM

## 2024-01-20 DIAGNOSIS — I10 Essential (primary) hypertension: Secondary | ICD-10-CM | POA: Diagnosis not present

## 2024-01-20 DIAGNOSIS — R739 Hyperglycemia, unspecified: Secondary | ICD-10-CM | POA: Diagnosis not present

## 2024-01-20 DIAGNOSIS — G43109 Migraine with aura, not intractable, without status migrainosus: Secondary | ICD-10-CM

## 2024-01-20 NOTE — Assessment & Plan Note (Signed)
 Currently well-controlled on very low-dose topiramate , she is now off of Aimovig.

## 2024-01-20 NOTE — Progress Notes (Addendum)
    Procedures performed today:    None.  Independent interpretation of notes and tests performed by another provider:   None.  Brief History, Exam, Impression, and Recommendations:    Annual physical exam Patient is back after a few months, she is happy, healthy, active, we will get labs, last mammo was 2024, declines Prevnar. Return to see me in a year.  Migraine headache Currently well-controlled on very low-dose topiramate , she is now off of Aimovig.    ____________________________________________ Debby PARAS. Curtis, M.D., ABFM., CAQSM., AME. Primary Care and Sports Medicine [facility]  Adjunct Professor of [facility] Medicine  [employer]  Restaurant manager, fast food

## 2024-01-20 NOTE — Assessment & Plan Note (Signed)
 Patient is back after a few months, she is happy, healthy, active, we will get labs, last mammo was 2024, declines Prevnar. Return to see me in a year.

## 2024-01-20 NOTE — Addendum Note (Signed)
 Addended by: CURTIS DEBBY PARAS on: 01/20/2024 01:51 PM   Modules accepted: Orders

## 2024-01-21 LAB — CBC
Hematocrit: 39.3 % (ref 34.0–46.6)
Hemoglobin: 13.4 g/dL (ref 11.1–15.9)
MCH: 31.9 pg (ref 26.6–33.0)
MCHC: 34.1 g/dL (ref 31.5–35.7)
MCV: 94 fL (ref 79–97)
Platelets: 249 10*3/uL (ref 150–450)
RBC: 4.2 x10E6/uL (ref 3.77–5.28)
RDW: 12.1 % (ref 11.7–15.4)
WBC: 6.8 10*3/uL (ref 3.4–10.8)

## 2024-01-21 LAB — LIPID PANEL
Chol/HDL Ratio: 3.1 ratio (ref 0.0–4.4)
Cholesterol, Total: 168 mg/dL (ref 100–199)
HDL: 54 mg/dL (ref >39)
LDL Chol Calc (NIH): 88 mg/dL (ref 0–99)
Triglycerides: 150 mg/dL — ABNORMAL HIGH (ref 0–149)
VLDL Cholesterol Cal: 26 mg/dL (ref 5–40)

## 2024-01-21 LAB — TSH: TSH: 1.17 u[IU]/mL (ref 0.450–4.500)

## 2024-01-21 LAB — COMPREHENSIVE METABOLIC PANEL WITH GFR
ALT: 35 IU/L — ABNORMAL HIGH (ref 0–32)
AST: 36 IU/L (ref 0–40)
Albumin: 4.8 g/dL (ref 3.9–4.9)
Alkaline Phosphatase: 74 IU/L (ref 44–121)
BUN/Creatinine Ratio: 21 (ref 12–28)
BUN: 18 mg/dL (ref 8–27)
Bilirubin Total: 0.4 mg/dL (ref 0.0–1.2)
CO2: 21 mmol/L (ref 20–29)
Calcium: 9.3 mg/dL (ref 8.7–10.3)
Chloride: 97 mmol/L (ref 96–106)
Creatinine, Ser: 0.87 mg/dL (ref 0.57–1.00)
Globulin, Total: 2.3 g/dL (ref 1.5–4.5)
Glucose: 83 mg/dL (ref 70–99)
Potassium: 4.1 mmol/L (ref 3.5–5.2)
Sodium: 133 mmol/L — ABNORMAL LOW (ref 134–144)
Total Protein: 7.1 g/dL (ref 6.0–8.5)
eGFR: 73 mL/min/{1.73_m2} (ref >59)

## 2024-01-21 LAB — VITAMIN D 25 HYDROXY (VIT D DEFICIENCY, FRACTURES): Vit D, 25-Hydroxy: 52.5 ng/mL (ref 30.0–100.0)

## 2024-01-21 LAB — HEMOGLOBIN A1C
Est. average glucose Bld gHb Est-mCnc: 100 mg/dL
Hgb A1c MFr Bld: 5.1 % (ref 4.8–5.6)

## 2024-01-22 ENCOUNTER — Ambulatory Visit: Payer: Self-pay | Admitting: Sports Medicine

## 2024-02-13 ENCOUNTER — Other Ambulatory Visit: Payer: Self-pay | Admitting: Sports Medicine

## 2024-02-13 DIAGNOSIS — F419 Anxiety disorder, unspecified: Secondary | ICD-10-CM

## 2024-02-16 ENCOUNTER — Telehealth: Payer: Self-pay

## 2024-02-16 ENCOUNTER — Telehealth: Payer: Self-pay | Admitting: Sports Medicine

## 2024-02-16 ENCOUNTER — Other Ambulatory Visit: Payer: Self-pay | Admitting: Sports Medicine

## 2024-02-16 DIAGNOSIS — F419 Anxiety disorder, unspecified: Secondary | ICD-10-CM

## 2024-02-16 NOTE — Telephone Encounter (Addendum)
 Medication has been approved by Dr. Curtis and was sent to patients preferred pharmacy. Patient has been advised.

## 2024-02-16 NOTE — Telephone Encounter (Signed)
 Sent patient a Clinical cytogeneticist message that prescription was sent to pharmacy.

## 2024-02-16 NOTE — Telephone Encounter (Signed)
 Copied from CRM [id number]. Topic: Clinical - Medication Question >> Feb 16, 2024 11:05 AM Miquel SAILOR wrote: Reason for CRM: alprazolam  (XANAX ) 2 MG tablet Patient out of refill needs this refill due to out of medication . Transferred to office

## 2024-02-16 NOTE — Telephone Encounter (Signed)
 Pt called office requesting refill on alprazolam  (XANAX ) 2 MG tablet. Patient is requesting that meds be sent to  [facility] - [place], [place] - [address]  [address], [place] [address]   CB: [phone]

## 2024-02-16 NOTE — Telephone Encounter (Signed)
 I have sent Med Refill request through patients chart to Dr. ONEIDA.

## 2024-03-02 ENCOUNTER — Ambulatory Visit (INDEPENDENT_AMBULATORY_CARE_PROVIDER_SITE_OTHER): Admitting: Sports Medicine

## 2024-03-02 VITALS — BP 118/71 | HR 75 | Ht 64.0 in | Wt 132.0 lb

## 2024-03-02 DIAGNOSIS — N6311 Unspecified lump in the right breast, upper outer quadrant: Secondary | ICD-10-CM

## 2024-03-02 DIAGNOSIS — N6310 Unspecified lump in the right breast, unspecified quadrant: Secondary | ICD-10-CM | POA: Insufficient documentation

## 2024-03-02 NOTE — Assessment & Plan Note (Signed)
 Very pleasant 68 year old female, she has a history of a breast augmentation about 40 years ago, more recently she has noted a nontender nodule right breast upper outer quadrant. We conducted a breast exam today with female chaperone, I was able to feel the nodule, it was approximately at the 10 o'clock position approximately 3 to 4 cm from the nipple. Axillary exam is normal, contralateral breast exam is normal. We will add a diagnostic mammogram and ultrasound, per her request in [place].

## 2024-03-02 NOTE — Progress Notes (Signed)
    Procedures performed today:    None.  Independent interpretation of notes and tests performed by another provider:   None.  Brief History, Exam, Impression, and Recommendations:    Mass of breast, right Very pleasant 68 year old female, she has a history of a breast augmentation about 40 years ago, more recently she has noted a nontender nodule right breast upper outer quadrant. We conducted a breast exam today with female chaperone, I was able to feel the nodule, it was approximately at the 10 o'clock position approximately 3 to 4 cm from the nipple. Axillary exam is normal, contralateral breast exam is normal. We will add a diagnostic mammogram and ultrasound, per her request in [place].    ____________________________________________ Debby PARAS. Curtis, M.D., ABFM., CAQSM., AME. Primary Care and Sports Medicine [facility]  Adjunct Professor of [employer] Medicine  [employer]  Restaurant manager, [employer]

## 2024-03-12 ENCOUNTER — Telehealth: Payer: Self-pay | Admitting: Sports Medicine

## 2024-03-12 NOTE — Telephone Encounter (Unsigned)
 Copied from CRM [id number]. Topic: Clinical - Request for Lab/Test Order >> Mar 12, 2024  9:06 AM Rosaria A wrote: Reason for CRM: Ellouise with [facility] states that they received an order for the patient for a breast ultrasound and bilateral diagnostic mammogram. Per Ellouise patients appointment is scheduled for 03/20/2024 and they are still missing the order for the bilateral diagnostic mammogram. Ellouise is needing that order sent to them.  Phone number: [phone] Fax number: [phone]

## 2024-03-14 NOTE — Telephone Encounter (Signed)
 Order is in imaging tab, can be printed and faxed.

## 2024-03-15 ENCOUNTER — Telehealth: Payer: Self-pay

## 2024-03-15 NOTE — Telephone Encounter (Signed)
 Copied from CRM [id number]. Topic: Clinical - Request for Lab/Test Order >> Mar 12, 2024  9:06 AM Rosaria A wrote: Reason for CRM: Ellouise with [facility] states that they received an order for the patient for a breast ultrasound and bilateral diagnostic mammogram. Per Ellouise patients appointment is scheduled for 03/20/2024 and they are still missing the order for the bilateral diagnostic mammogram. Ellouise is needing that order sent to them.  Phone number: [phone] Fax number: [phone] >> Mar 15, 2024  1:58 PM Graeme ORN wrote: Patient called. States she spoke with someone at [facility]. They overrode order that was put in by Dr T. Marit patient does not need bilateral she just needs ultrasound of right breast due to not having lump in left and recently being seen there. Patient is confused and would like to know if this is ok with Dr T. If not can he reach out to then to clarify. Patient would like a call back. States appt schedule for Tuesday. Thank you

## 2024-03-15 NOTE — Telephone Encounter (Signed)
 Order printed and faxed. Confirmation page received.

## 2024-03-15 NOTE — Telephone Encounter (Signed)
 Patient informed.

## 2024-03-15 NOTE — Telephone Encounter (Signed)
 Its fine

## 2024-03-17 ENCOUNTER — Other Ambulatory Visit: Payer: Self-pay | Admitting: Sports Medicine

## 2024-03-17 DIAGNOSIS — F419 Anxiety disorder, unspecified: Secondary | ICD-10-CM

## 2024-03-19 ENCOUNTER — Telehealth: Payer: Self-pay

## 2024-03-19 NOTE — Telephone Encounter (Signed)
 Copied from CRM #[id number]. Topic: Clinical - Request for Lab/Test Order >> Mar 19, 2024 10:02 AM Laurier BROCKS wrote: Reason for CRM: Princess from [facility] [phone] is calling to request a order for diagnostic mammogram and ultra sound for both breast (right and left). Patient has an appointment tomorrow at 1:15 pm. Please fax back to [phone]

## 2024-03-19 NOTE — Telephone Encounter (Signed)
 Ok to place orders

## 2024-03-19 NOTE — Telephone Encounter (Signed)
 Patient called to check status of the request. States she wanted to make sure pharmacy requested. Let her know it is pending. She says she does not have any left. Thank You

## 2024-03-20 ENCOUNTER — Telehealth: Payer: Self-pay

## 2024-03-20 ENCOUNTER — Other Ambulatory Visit: Payer: Self-pay | Admitting: *Deleted

## 2024-03-20 DIAGNOSIS — N6311 Unspecified lump in the right breast, upper outer quadrant: Secondary | ICD-10-CM

## 2024-03-20 LAB — HM MAMMOGRAPHY

## 2024-03-20 NOTE — Progress Notes (Unsigned)
 Orders redone and refaxed confirmation received.

## 2024-03-20 NOTE — Telephone Encounter (Signed)
 Spoke with radiology - states they have received the updated bilateral mammogram order and is attached to patient chart.

## 2024-03-20 NOTE — Telephone Encounter (Signed)
 Copied from CRM #[id number]. Topic: Clinical - Request for Lab/Test Order >> Mar 20, 2024  9:06 AM Zane F wrote: Reason for CRM:   Caller: [name]  Calling from: [facility]    Concerns: The patient is coming in today for a bilateral diagnostic mammogram and ultrasound. The patient is in need of a doctor's orders for these two imaging services sent over to [facility]  Breast Clinic      [email or web address] Medical center in [place], [place] [address] [place], [place], [place] [address] [phone]  Callback Number: [phone]  Fax Number: [phone]

## 2024-03-20 NOTE — Telephone Encounter (Signed)
 Orders printed and faxed. Confirmation received.

## 2024-03-20 NOTE — Telephone Encounter (Signed)
 Orders were faxed again this morning. I tried to call Katherine Lawson, no answer.

## 2024-03-20 NOTE — Telephone Encounter (Signed)
 Copied from CRM #[id number]. Topic: Clinical - Request for Lab/Test Order >> Mar 19, 2024 10:02 AM Laurier BROCKS wrote: Reason for CRM: Princess from [facility] [phone] is calling to request a order for diagnostic mammogram and ultra sound for both breast (right and left). Patient has an appointment tomorrow at 1:15 pm. Please fax back to [phone] >> Mar 20, 2024  9:14 AM Zane F wrote: Caller: [name]        Calling from: [facility]                Concerns: The patient is coming in today for a bilateral diagnostic mammogram and ultrasound. The patient is in need of a doctor's orders for these two imaging services sent over to [facility]        Breast Clinic            [email or web address]    Medical center in [place], [place]    [address] [place], [place], [place] [address]    [phone]        Callback Number: [phone]        Fax Number: [phone]

## 2024-03-21 ENCOUNTER — Encounter: Payer: Self-pay | Admitting: Family Medicine

## 2024-03-27 ENCOUNTER — Encounter: Payer: Self-pay | Admitting: Sports Medicine

## 2024-04-10 ENCOUNTER — Other Ambulatory Visit: Payer: Self-pay

## 2024-04-10 DIAGNOSIS — F419 Anxiety disorder, unspecified: Secondary | ICD-10-CM

## 2024-04-10 NOTE — Telephone Encounter (Signed)
 Patient called office concerning medication refills, is it ok to refill medications, please advise, thanks.

## 2024-04-11 NOTE — Telephone Encounter (Signed)
 Please call patient and see which she needs refills on and please remind come IN that Benton is not here and to please not route anything to her basket and please route to the covering provider.

## 2024-04-12 NOTE — Telephone Encounter (Signed)
 Per the provider's request, attempted to contact the patient. Call unsuccessful. Unable to leave a msg, vm is full.

## 2024-04-16 MED ORDER — ALPRAZOLAM 2 MG PO TABS: 2.0000 mg | ORAL_TABLET | Freq: Two times a day (BID) | ORAL | 0 refills | Status: AC | PRN

## 2024-04-16 NOTE — Telephone Encounter (Signed)
 Patient need refills for medications alprazolam  2mg  tablet and progesterone  100 MG. Will be out after today.  Pharmacy is [facility], [address] [address], [place] [address].

## 2024-04-16 NOTE — Telephone Encounter (Signed)
 Last fill 03/19/24 next visit with [facility] 04/24/24  Pended prescription.   The progesterone  has never been filled by Dr Curtis.

## 2024-04-19 ENCOUNTER — Encounter

## 2024-05-15 ENCOUNTER — Ambulatory Visit

## 2024-05-29 ENCOUNTER — Encounter: Admitting: Urgent Care
